# Patient Record
Sex: Female | Born: 1950 | Race: White | Hispanic: No
Health system: Southern US, Community
[De-identification: ages and names within clinical notes are randomized; demographics above are authoritative.]

## PROBLEM LIST (undated history)

## (undated) DIAGNOSIS — I651 Occlusion and stenosis of basilar artery: Secondary | ICD-10-CM

## (undated) DIAGNOSIS — E785 Hyperlipidemia, unspecified: Secondary | ICD-10-CM

## (undated) DIAGNOSIS — I10 Essential (primary) hypertension: Secondary | ICD-10-CM

## (undated) DIAGNOSIS — E119 Type 2 diabetes mellitus without complications: Secondary | ICD-10-CM

## (undated) DIAGNOSIS — E039 Hypothyroidism, unspecified: Secondary | ICD-10-CM

## (undated) DIAGNOSIS — I6509 Occlusion and stenosis of unspecified vertebral artery: Secondary | ICD-10-CM

---

## 2015-01-05 ENCOUNTER — Other Ambulatory Visit (HOSPITAL_COMMUNITY): Payer: Self-pay | Admitting: Interventional Radiology

## 2015-01-05 DIAGNOSIS — I6502 Occlusion and stenosis of left vertebral artery: Secondary | ICD-10-CM

## 2015-01-31 ENCOUNTER — Ambulatory Visit (HOSPITAL_COMMUNITY)
Admission: RE | Admit: 2015-01-31 | Discharge: 2015-01-31 | Disposition: A | Discharge status: Home / Self Care | Payer: Medicare HMO | Source: Ambulatory Visit | Attending: Interventional Radiology | Admitting: Interventional Radiology

## 2015-01-31 ENCOUNTER — Other Ambulatory Visit (HOSPITAL_COMMUNITY): Payer: Self-pay | Admitting: Interventional Radiology

## 2015-01-31 DIAGNOSIS — I6502 Occlusion and stenosis of left vertebral artery: Secondary | ICD-10-CM

## 2015-01-31 DIAGNOSIS — I771 Stricture of artery: Secondary | ICD-10-CM

## 2015-02-09 ENCOUNTER — Other Ambulatory Visit: Payer: Self-pay | Admitting: Radiology

## 2015-02-09 ENCOUNTER — Other Ambulatory Visit: Payer: Self-pay | Admitting: Physician Assistant

## 2015-02-10 ENCOUNTER — Other Ambulatory Visit (HOSPITAL_COMMUNITY): Payer: Self-pay | Admitting: Interventional Radiology

## 2015-02-10 ENCOUNTER — Ambulatory Visit (HOSPITAL_COMMUNITY)
Admission: RE | Admit: 2015-02-10 | Discharge: 2015-02-10 | Disposition: A | Discharge status: Home / Self Care | Payer: Medicare HMO | Source: Ambulatory Visit | Attending: Interventional Radiology | Admitting: Interventional Radiology

## 2015-02-10 ENCOUNTER — Encounter (HOSPITAL_COMMUNITY): Payer: Self-pay

## 2015-02-10 DIAGNOSIS — I6502 Occlusion and stenosis of left vertebral artery: Secondary | ICD-10-CM | POA: Diagnosis not present

## 2015-02-10 DIAGNOSIS — E119 Type 2 diabetes mellitus without complications: Secondary | ICD-10-CM | POA: Diagnosis not present

## 2015-02-10 DIAGNOSIS — I771 Stricture of artery: Secondary | ICD-10-CM

## 2015-02-10 DIAGNOSIS — Z7982 Long term (current) use of aspirin: Secondary | ICD-10-CM | POA: Insufficient documentation

## 2015-02-10 DIAGNOSIS — I10 Essential (primary) hypertension: Secondary | ICD-10-CM | POA: Diagnosis not present

## 2015-02-10 DIAGNOSIS — I671 Cerebral aneurysm, nonruptured: Secondary | ICD-10-CM | POA: Diagnosis not present

## 2015-02-10 DIAGNOSIS — E785 Hyperlipidemia, unspecified: Secondary | ICD-10-CM | POA: Insufficient documentation

## 2015-02-10 DIAGNOSIS — Z91040 Latex allergy status: Secondary | ICD-10-CM | POA: Insufficient documentation

## 2015-02-10 DIAGNOSIS — Z7984 Long term (current) use of oral hypoglycemic drugs: Secondary | ICD-10-CM | POA: Insufficient documentation

## 2015-02-10 DIAGNOSIS — E039 Hypothyroidism, unspecified: Secondary | ICD-10-CM | POA: Diagnosis not present

## 2015-02-10 DIAGNOSIS — Z885 Allergy status to narcotic agent status: Secondary | ICD-10-CM | POA: Diagnosis not present

## 2015-02-10 DIAGNOSIS — Z79899 Other long term (current) drug therapy: Secondary | ICD-10-CM | POA: Insufficient documentation

## 2015-02-10 HISTORY — DX: Occlusion and stenosis of unspecified vertebral artery: I65.1

## 2015-02-10 HISTORY — DX: Hypothyroidism, unspecified: E03.9

## 2015-02-10 HISTORY — DX: Type 2 diabetes mellitus without complications: E11.9

## 2015-02-10 HISTORY — DX: Essential (primary) hypertension: I10

## 2015-02-10 HISTORY — DX: Occlusion and stenosis of unspecified vertebral artery: I65.09

## 2015-02-10 HISTORY — DX: Hyperlipidemia, unspecified: E78.5

## 2015-02-10 LAB — BASIC METABOLIC PANEL
ANION GAP: 14 (ref 5–15)
BUN: 10 mg/dL (ref 6–20)
CALCIUM: 9.8 mg/dL (ref 8.9–10.3)
CO2: 27 mmol/L (ref 22–32)
Chloride: 99 mmol/L — ABNORMAL LOW (ref 101–111)
Creatinine, Ser: 0.8 mg/dL (ref 0.44–1.00)
GLUCOSE: 126 mg/dL — AB (ref 65–99)
POTASSIUM: 3.6 mmol/L (ref 3.5–5.1)
SODIUM: 140 mmol/L (ref 135–145)

## 2015-02-10 LAB — CBC
HCT: 37.1 % (ref 36.0–46.0)
HEMOGLOBIN: 12.7 g/dL (ref 12.0–15.0)
MCH: 31.2 pg (ref 26.0–34.0)
MCHC: 34.2 g/dL (ref 30.0–36.0)
MCV: 91.2 fL (ref 78.0–100.0)
Platelets: 250 10*3/uL (ref 150–400)
RBC: 4.07 MIL/uL (ref 3.87–5.11)
RDW: 12.9 % (ref 11.5–15.5)
WBC: 4.8 10*3/uL (ref 4.0–10.5)

## 2015-02-10 LAB — APTT: APTT: 29 s (ref 24–37)

## 2015-02-10 LAB — GLUCOSE, CAPILLARY
GLUCOSE-CAPILLARY: 102 mg/dL — AB (ref 65–99)
Glucose-Capillary: 122 mg/dL — ABNORMAL HIGH (ref 65–99)

## 2015-02-10 LAB — PROTIME-INR
INR: 1.1 (ref 0.00–1.49)
PROTHROMBIN TIME: 14.3 s (ref 11.6–15.2)

## 2015-02-10 MED ORDER — FENTANYL CITRATE (PF) 100 MCG/2ML IJ SOLN
INTRAMUSCULAR | Status: AC | PRN
  Administered 2015-02-10 (×2): 25 ug via INTRAVENOUS

## 2015-02-10 MED ORDER — HEPARIN SODIUM (PORCINE) 1000 UNIT/ML IJ SOLN
INTRAMUSCULAR | Status: AC | PRN
  Administered 2015-02-10: 1000 [IU] via INTRAVENOUS

## 2015-02-10 MED ORDER — SODIUM CHLORIDE 0.9 % IV SOLN
INTRAVENOUS | Status: DC
  Administered 2015-02-10: 07:00:00 via INTRAVENOUS

## 2015-02-10 MED ORDER — FENTANYL CITRATE (PF) 100 MCG/2ML IJ SOLN
INTRAMUSCULAR | Status: AC
  Filled 2015-02-10: qty 2

## 2015-02-10 MED ORDER — LIDOCAINE HCL 1 % IJ SOLN
INTRAMUSCULAR | Status: AC
  Filled 2015-02-10: qty 20

## 2015-02-10 MED ORDER — HEPARIN SOD (PORK) LOCK FLUSH 100 UNIT/ML IV SOLN
INTRAVENOUS | Status: AC
  Filled 2015-02-10: qty 20

## 2015-02-10 MED ORDER — MIDAZOLAM HCL 2 MG/2ML IJ SOLN
INTRAMUSCULAR | Status: AC
  Filled 2015-02-10: qty 2

## 2015-02-10 MED ORDER — IOHEXOL 300 MG/ML  SOLN
150.0000 mL | Freq: Once | INTRAMUSCULAR | Status: AC | PRN
  Administered 2015-02-10: 80 mL via INTRAVENOUS

## 2015-02-10 MED ORDER — MIDAZOLAM HCL 2 MG/2ML IJ SOLN
INTRAMUSCULAR | Status: AC | PRN
  Administered 2015-02-10: 1 mg via INTRAVENOUS

## 2015-02-10 MED ORDER — SODIUM CHLORIDE 0.9 % IV SOLN: INTRAVENOUS | Status: AC

## 2015-02-10 NOTE — Sedation Documentation (Signed)
Patient denies pain and is resting comfortably.  

## 2015-02-10 NOTE — Discharge Instructions (Signed)

## 2015-02-10 NOTE — H&P (Signed)
Chief Complaint: "I'm here for an angiogram"  HPI: Kendra Acevedo is an 65 y.o. female seen in consult by Dr. Estanislado Pandy for symptoms felt related to vertebrobasilar artery stenosis. She is scheduled for cerebral arteriogram today. PMHx, meds, labs, allergies reviewed. Has been NPO this am  Past Medical History:  Past Medical History  Diagnosis Date  . Diabetes mellitus ([place])   . HTN (hypertension)   . Hyperlipidemia   . Hypothyroidism   . Vertebrobasilar artery stenosis     Past Surgical History: History reviewed. No pertinent past surgical history.  Family History: History reviewed. No pertinent family history.  Social History:  has no tobacco, alcohol, and drug history on file.  Allergies:  Allergies  Allergen Reactions  . Hydrocodone-Acetaminophen Itching    Other reaction(s): Tachycardia / Palpitations  (intolerance)  . Latex     Other reaction(s): Other (See Comments) Blisters skin  . Codeine Itching and Rash    Other reaction(s): Tachycardia / Palpitations  (intolerance)  . Yellow Dye Itching and Rash    Other reaction(s): Other (See Comments) Blisters **Pt. Cannot take any YELLOW medications**    Medications:   Medication List    ASK your doctor about these medications        amitriptyline 50 MG tablet  Commonly known as:  ELAVIL  Take 50 mg by mouth at bedtime.     aspirin EC 81 MG tablet  Take 81 mg by mouth daily.     b complex vitamins tablet  Take 1 tablet by mouth daily.     cycloSPORINE 0.05 % ophthalmic emulsion  Commonly known as:  RESTASIS  Place 1 drop into both eyes 2 (two) times daily.     hydrochlorothiazide 25 MG tablet  Commonly known as:  HYDRODIURIL  Take 12.5 mg by mouth daily.     levETIRAcetam 500 MG tablet  Commonly known as:  KEPPRA  Take 500 mg by mouth 2 (two) times daily.     levothyroxine 50 MCG tablet  Commonly known as:  SYNTHROID, LEVOTHROID  Take 50 mcg by mouth daily before breakfast.     lisinopril 20 MG  tablet  Commonly known as:  PRINIVIL,ZESTRIL  Take 10 mg by mouth at bedtime.     metFORMIN 1000 MG tablet  Commonly known as:  GLUCOPHAGE  Take 500-1,000 mg by mouth 2 (two) times daily with a meal. Take 1000 mg every morning and 500 mg every evening     multivitamin capsule  Take 1 capsule by mouth daily.     simvastatin 40 MG tablet  Commonly known as:  ZOCOR  Take 40 mg by mouth every evening.     tiZANidine 4 MG tablet  Commonly known as:  ZANAFLEX  Take 4 mg by mouth at bedtime.     vitamin B-12 1000 MCG tablet  Commonly known as:  CYANOCOBALAMIN  Take 2,000 mcg by mouth daily.        Please HPI for pertinent positives, otherwise complete 10 system ROS negative.  Physical Exam: BP 158/78 mmHg  Pulse 86  Temp(Src) 97.7 F (36.5 C) (Oral)  Resp 17  Ht _0  (1.651 m)  Wt 167 lb 11.2 oz (76.068 kg)  BMI 27.91 kg/m2  SpO2 100% Body mass index is 27.91 kg/(m^2).   General Appearance:  Alert, cooperative, no distress, appears stated age  Head:  Normocephalic, without obvious abnormality, atraumatic  ENT: Unremarkable  Neck: Supple, symmetrical, trachea midline  Lungs:   Clear to auscultation bilaterally, no  w/r/r, respirations unlabored without use of accessory muscles.  Chest Wall:  No tenderness or deformity  Heart:  Regular rate and rhythm, S1, S2 normal, no murmur, rub or gallop.  Abdomen:   Soft, non-tender, non distended.  Extremities: Extremities normal, atraumatic, no cyanosis or edema  Pulses: 2+ and symmetric femoral  Neurologic: Normal affect, no gross deficits.  Labs: Results for orders placed or performed during the hospital encounter of 02/10/15 (from the past 48 hour(s))  Glucose, capillary     Status: Abnormal   Collection Time: 02/10/15  6:32 AM  Result Value Ref Range   Glucose-Capillary 122 (H) 65 - 99 mg/dL  APTT     Status: None   Collection Time: 02/10/15  7:04 AM  Result Value Ref Range   aPTT 29 24 - 37 seconds  Basic metabolic  panel     Status: Abnormal   Collection Time: 02/10/15  7:04 AM  Result Value Ref Range   Sodium 140 135 - 145 mmol/L   Potassium 3.6 3.5 - 5.1 mmol/L   Chloride 99 (L) 101 - 111 mmol/L   CO2 27 22 - 32 mmol/L   Glucose, Bld 126 (H) 65 - 99 mg/dL   BUN 10 6 - 20 mg/dL   Creatinine, Ser 0.80 0.44 - 1.00 mg/dL   Calcium 9.8 8.9 - 10.3 mg/dL   GFR calc non Af Amer >60 >60 mL/min   GFR calc Af Amer >60 >60 mL/min    Comment: (NOTE) The eGFR has been calculated using the CKD EPI equation. This calculation has not been validated in all clinical situations. eGFR's persistently <60 mL/min signify possible Chronic Kidney Disease.    Anion gap 14 5 - 15  CBC     Status: None   Collection Time: 02/10/15  7:04 AM  Result Value Ref Range   WBC 4.8 4.0 - 10.5 K/uL   RBC 4.07 3.87 - 5.11 MIL/uL   Hemoglobin 12.7 12.0 - 15.0 g/dL   HCT 37.1 36.0 - 46.0 %   MCV 91.2 78.0 - 100.0 fL   MCH 31.2 26.0 - 34.0 pg   MCHC 34.2 30.0 - 36.0 g/dL   RDW 12.9 11.5 - 15.5 %   Platelets 250 150 - 400 K/uL  Protime-INR     Status: None   Collection Time: 02/10/15  7:04 AM  Result Value Ref Range   Prothrombin Time 14.3 11.6 - 15.2 seconds   INR 1.10 0.00 - 1.49    Imaging: No results found.  Assessment/Plan Vertebrobasilar stenosis Plan for cerebral angio Labs ok Risks and Benefits discussed with the patient including, but not limited to bleeding, infection, vascular injury or contrast induced renal failure. All of the patient's questions were answered, patient is agreeable to proceed. Consent signed and in chart.   Ascencion Dike PA-C 02/10/2015, 8:25 AM

## 2015-02-10 NOTE — Progress Notes (Signed)
Pt up to bathroom tolerated well.  Right groin level 0 after ambulation

## 2015-02-10 NOTE — Procedures (Signed)
S/P 4 cerebral arterigram  RT CFA approach. 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

## 2015-03-03 ENCOUNTER — Other Ambulatory Visit (HOSPITAL_COMMUNITY): Payer: Self-pay | Admitting: Interventional Radiology

## 2015-03-03 DIAGNOSIS — I771 Stricture of artery: Secondary | ICD-10-CM

## 2015-03-03 DIAGNOSIS — I671 Cerebral aneurysm, nonruptured: Secondary | ICD-10-CM

## 2015-03-17 ENCOUNTER — Ambulatory Visit (HOSPITAL_COMMUNITY)
Admission: RE | Admit: 2015-03-17 | Discharge: 2015-03-17 | Disposition: A | Discharge status: Home / Self Care | Payer: Medicare HMO | Source: Ambulatory Visit | Attending: Interventional Radiology | Admitting: Interventional Radiology

## 2015-03-17 DIAGNOSIS — I671 Cerebral aneurysm, nonruptured: Secondary | ICD-10-CM

## 2015-03-17 DIAGNOSIS — I771 Stricture of artery: Secondary | ICD-10-CM

## 2015-04-04 ENCOUNTER — Telehealth (HOSPITAL_COMMUNITY): Payer: Self-pay | Admitting: Interventional Radiology

## 2015-04-04 NOTE — Telephone Encounter (Signed)
Spoke to W. R. BerkleyDeveshwar and told him that the pt had stopped her Plavix all together and was only taking Aspirin 81mg  1 daily. She states that the Plavix gave her nosebleeds, extreme bruising, chest and back heaviness. She discontinued this on her own and says that her sx have now resolved. Deveshwar told her to continue as is and that he would follow-up with a cerebral angiogram in 6 month's time. If she develops any sx she is to call us back immediately. Pt agrees with this plan of care. JM

## 2015-04-04 NOTE — Telephone Encounter (Signed)
Spoke to pt, she says she stopped her Plavix as of Saturday. She states it was causing her "chest heaviness and back pain." I told her I would speak to Dr. Monica Martinezev and call her back. JM

## 2015-06-01 ENCOUNTER — Other Ambulatory Visit (HOSPITAL_COMMUNITY): Payer: Self-pay | Admitting: Interventional Radiology

## 2015-06-01 ENCOUNTER — Telehealth (HOSPITAL_COMMUNITY): Payer: Self-pay

## 2015-06-01 DIAGNOSIS — I771 Stricture of artery: Secondary | ICD-10-CM

## 2015-06-01 NOTE — Telephone Encounter (Signed)
Called to schedule CT scan, left message for pt to call back. AW 

## 2015-06-07 ENCOUNTER — Ambulatory Visit (HOSPITAL_COMMUNITY): Admission: RE | Admit: 2015-06-07 | Payer: Medicare HMO | Source: Ambulatory Visit

## 2015-06-07 ENCOUNTER — Encounter (HOSPITAL_COMMUNITY): Payer: Self-pay

## 2015-06-07 ENCOUNTER — Ambulatory Visit (HOSPITAL_COMMUNITY)
Admission: RE | Admit: 2015-06-07 | Discharge: 2015-06-07 | Disposition: A | Discharge status: Home / Self Care | Payer: Medicare HMO | Source: Ambulatory Visit | Attending: Interventional Radiology | Admitting: Interventional Radiology

## 2015-06-07 DIAGNOSIS — I6529 Occlusion and stenosis of unspecified carotid artery: Secondary | ICD-10-CM | POA: Insufficient documentation

## 2015-06-07 DIAGNOSIS — I6502 Occlusion and stenosis of left vertebral artery: Secondary | ICD-10-CM | POA: Insufficient documentation

## 2015-06-07 DIAGNOSIS — I771 Stricture of artery: Secondary | ICD-10-CM | POA: Diagnosis not present

## 2015-06-07 LAB — POCT I-STAT CREATININE: Creatinine, Ser: 0.7 mg/dL (ref 0.44–1.00)

## 2015-06-07 MED ORDER — IOPAMIDOL (ISOVUE-370) INJECTION 76%
INTRAVENOUS | Status: AC
  Administered 2015-06-07: 50 mL
  Filled 2015-06-07: qty 50

## 2015-06-27 ENCOUNTER — Other Ambulatory Visit (HOSPITAL_COMMUNITY): Payer: Self-pay | Admitting: Interventional Radiology

## 2015-06-27 DIAGNOSIS — I771 Stricture of artery: Secondary | ICD-10-CM

## 2015-06-27 DIAGNOSIS — I729 Aneurysm of unspecified site: Secondary | ICD-10-CM

## 2015-07-03 ENCOUNTER — Other Ambulatory Visit: Payer: Self-pay | Admitting: Radiology

## 2015-07-04 ENCOUNTER — Encounter (HOSPITAL_COMMUNITY): Payer: Self-pay

## 2015-07-04 ENCOUNTER — Ambulatory Visit (HOSPITAL_COMMUNITY)
Admission: RE | Admit: 2015-07-04 | Discharge: 2015-07-04 | Disposition: A | Discharge status: Home / Self Care | Payer: Medicare HMO | Source: Ambulatory Visit | Attending: Interventional Radiology | Admitting: Interventional Radiology

## 2015-07-04 DIAGNOSIS — E785 Hyperlipidemia, unspecified: Secondary | ICD-10-CM | POA: Diagnosis not present

## 2015-07-04 DIAGNOSIS — E119 Type 2 diabetes mellitus without complications: Secondary | ICD-10-CM | POA: Insufficient documentation

## 2015-07-04 DIAGNOSIS — E039 Hypothyroidism, unspecified: Secondary | ICD-10-CM | POA: Diagnosis not present

## 2015-07-04 DIAGNOSIS — I10 Essential (primary) hypertension: Secondary | ICD-10-CM | POA: Diagnosis not present

## 2015-07-04 DIAGNOSIS — I6502 Occlusion and stenosis of left vertebral artery: Secondary | ICD-10-CM | POA: Insufficient documentation

## 2015-07-04 DIAGNOSIS — I771 Stricture of artery: Secondary | ICD-10-CM | POA: Insufficient documentation

## 2015-07-04 DIAGNOSIS — Z7984 Long term (current) use of oral hypoglycemic drugs: Secondary | ICD-10-CM | POA: Insufficient documentation

## 2015-07-04 DIAGNOSIS — I729 Aneurysm of unspecified site: Secondary | ICD-10-CM

## 2015-07-04 DIAGNOSIS — Z7982 Long term (current) use of aspirin: Secondary | ICD-10-CM | POA: Insufficient documentation

## 2015-07-04 DIAGNOSIS — I671 Cerebral aneurysm, nonruptured: Secondary | ICD-10-CM | POA: Diagnosis not present

## 2015-07-04 LAB — BASIC METABOLIC PANEL
Anion gap: 9 (ref 5–15)
BUN: 11 mg/dL (ref 6–20)
CO2: 30 mmol/L (ref 22–32)
CREATININE: 0.76 mg/dL (ref 0.44–1.00)
Calcium: 10 mg/dL (ref 8.9–10.3)
Chloride: 99 mmol/L — ABNORMAL LOW (ref 101–111)
GFR calc Af Amer: >60 mL/min (ref >60)
Glucose, Bld: 133 mg/dL — ABNORMAL HIGH (ref 65–99)
POTASSIUM: 3.8 mmol/L (ref 3.5–5.1)
SODIUM: 138 mmol/L (ref 135–145)

## 2015-07-04 LAB — CBC WITH DIFFERENTIAL/PLATELET
BASOS ABS: 0 10*3/uL (ref 0.0–0.1)
Basophils Relative: 1 %
EOS ABS: 0.3 10*3/uL (ref 0.0–0.7)
EOS PCT: 7 %
HCT: 37.7 % (ref 36.0–46.0)
Hemoglobin: 12.5 g/dL (ref 12.0–15.0)
LYMPHS PCT: 35 %
Lymphs Abs: 1.6 10*3/uL (ref 0.7–4.0)
MCH: 29.8 pg (ref 26.0–34.0)
MCHC: 33.2 g/dL (ref 30.0–36.0)
MCV: 90 fL (ref 78.0–100.0)
Monocytes Absolute: 0.3 10*3/uL (ref 0.1–1.0)
Monocytes Relative: 7 %
Neutro Abs: 2.2 10*3/uL (ref 1.7–7.7)
Neutrophils Relative %: 50 %
PLATELETS: 237 10*3/uL (ref 150–400)
RBC: 4.19 MIL/uL (ref 3.87–5.11)
RDW: 12.8 % (ref 11.5–15.5)
WBC: 4.4 10*3/uL (ref 4.0–10.5)

## 2015-07-04 LAB — GLUCOSE, CAPILLARY: GLUCOSE-CAPILLARY: 125 mg/dL — AB (ref 65–99)

## 2015-07-04 LAB — PROTIME-INR
INR: 1.1 (ref 0.00–1.49)
PROTHROMBIN TIME: 14.4 s (ref 11.6–15.2)

## 2015-07-04 MED ORDER — SODIUM CHLORIDE 0.9 % IV SOLN
INTRAVENOUS | Status: DC
  Administered 2015-07-04: 09:00:00 via INTRAVENOUS

## 2015-07-04 MED ORDER — LIDOCAINE HCL 1 % IJ SOLN
INTRAMUSCULAR | Status: AC
  Filled 2015-07-04: qty 20

## 2015-07-04 MED ORDER — IOPAMIDOL (ISOVUE-300) INJECTION 61%
INTRAVENOUS | Status: AC
  Filled 2015-07-04: qty 150

## 2015-07-04 NOTE — H&P (Signed)
Chief Complaint: Patient was seen in consultation today for cerebral arteriogram at the request of Dr Curt Bears  Referring Physician(s): Dr Curt Bears   Supervising Physician: Julieanne Cotton  Patient Status: Outpatient  History of Present Illness: Kendra Acevedo is a 65 y.o. female   Symptoms of vertigo; dizziness--worsening symptoms recently Previous cerebral arteriogram 03/2015 reveals: Right vertebrobasilar artery stenosis and R Internal carotid artery aneurysm  Scheduled for routine follow up for 08/2015 But with new and worsening sxs-- scheduled for today  CTA 06/07/2015: IMPRESSION: 1. Stable left vertebral artery findings since February with occlusion at its origin and at the skullbase. Continued retrograde supply of the distal left V4 segment and left PICA origin. No right vertebral artery stenosis. 2. Stable minimal to mild for age carotid atherosclerosis with no significant stenosis. No CTA evidence of the small right ICA superior hypophyseal region aneurysm suspected by conventional angiogram. 3. Stable mild distal right MCA M1 irregularity probably due to atherosclerosis without significant stenosis. 4. Stable and negative CT appearance of the brain. No acute findings in the neck.  Scheduled for cerebral arteriogram for full evaluation of vessels  Past Medical History  Diagnosis Date  . Diabetes mellitus ([facility])   . HTN (hypertension)   . Hyperlipidemia   . Hypothyroidism   . Vertebrobasilar artery stenosis     History reviewed. No pertinent past surgical history.  Allergies: Hydrocodone-acetaminophen; Latex; Plavix; Codeine; and Yellow dye  Medications: Prior to Admission medications   Medication Sig Start Date End Date Taking? Authorizing Provider  amitriptyline (ELAVIL) 50 MG tablet Take 50 mg by mouth at bedtime.   Yes Historical Provider, MD  aspirin EC 81 MG tablet Take 81 mg by mouth daily.   Yes Historical Provider, MD  b complex  vitamins tablet Take 1 tablet by mouth daily.   Yes Historical Provider, MD  cycloSPORINE (RESTASIS) 0.05 % ophthalmic emulsion Place 1 drop into both eyes 2 (two) times daily.   Yes Historical Provider, MD  hydrochlorothiazide (HYDRODIURIL) 25 MG tablet Take 12.5 mg by mouth daily.   Yes Historical Provider, MD  levETIRAcetam (KEPPRA) 500 MG tablet Take 500-1,000 mg by mouth daily.    Yes Historical Provider, MD  levothyroxine (SYNTHROID, LEVOTHROID) 50 MCG tablet Take 50 mcg by mouth daily before breakfast.    Yes Historical Provider, MD  lisinopril (PRINIVIL,ZESTRIL) 20 MG tablet Take 10 mg by mouth at bedtime.   Yes Historical Provider, MD  metFORMIN (GLUCOPHAGE) 1000 MG tablet Take 500-1,000 mg by mouth 2 (two) times daily with a meal. Take 1000 mg every morning and 500 mg every evening 12/26/14  Yes Historical Provider, MD  Multiple Vitamin (MULTIVITAMIN) capsule Take 1 capsule by mouth daily.    Yes Historical Provider, MD  simvastatin (ZOCOR) 40 MG tablet Take 40 mg by mouth every evening.   Yes Historical Provider, MD  sodium chloride (OCEAN) 0.65 % SOLN nasal spray Place 1 spray into both nostrils as needed for congestion.   Yes Historical Provider, MD  tiZANidine (ZANAFLEX) 4 MG tablet Take 4 mg by mouth at bedtime.   Yes Historical Provider, MD  vitamin B-12 (CYANOCOBALAMIN) 1000 MCG tablet Take 2,000 mcg by mouth daily.    Yes Historical Provider, MD     History reviewed. No pertinent family history.  Social History   Social History  . Marital Status: Widowed    Spouse Name: N/A  . Number of Children: N/A  . Years of Education: N/A   Social History Main  Topics  . Smoking status: None  . Smokeless tobacco: None  . Alcohol Use: None  . Drug Use: None  . Sexual Activity: Not Asked   Other Topics Concern  . None   Social History Narrative     Review of Systems: A 12 point ROS discussed and pertinent positives are indicated in the HPI above.  All other systems are  negative.  Review of Systems  Constitutional: Positive for activity change. Negative for fever, appetite change and fatigue.  HENT: Positive for tinnitus and trouble swallowing.   Eyes: Negative for visual disturbance.  Respiratory: Negative for shortness of breath.   Gastrointestinal: Negative for abdominal pain.  Neurological: Positive for dizziness, speech difficulty, weakness and light-headedness. Negative for tremors, seizures, syncope, facial asymmetry, numbness and headaches.       Difficulty with some word finding  Psychiatric/Behavioral: Negative for behavioral problems and confusion.    Vital Signs: BP 145/91 mmHg  Pulse 81  Temp(Src) 98.4 F (36.9 C) (Oral)  Resp 16  Ht 5' 5.5" (1.664 m)  Wt 170 lb (77.111 kg)  BMI 27.85 kg/m2  SpO2 100%  Physical Exam  HENT:  Speech is slow  Eyes: EOM are normal.  Neck: Neck supple.  Cardiovascular: Normal rate, regular rhythm and normal heart sounds.   Pulmonary/Chest: Effort normal and breath sounds normal.  Abdominal: Soft. Bowel sounds are normal.  Musculoskeletal: Normal range of motion.  Hand writing slow and some what difficult  Neurological: She is alert.  Skin: Skin is warm and dry.  Psychiatric: She has a normal mood and affect. Her behavior is normal. Judgment and thought content normal.  Nursing note and vitals reviewed.   Mallampati Score:  MD Evaluation Airway: WNL Heart: WNL Abdomen: WNL Chest/ Lungs: WNL ASA  Classification: 3 Mallampati/Airway Score: Two  Imaging: Ct Angio Head W/cm &/or Wo Cm  06/07/2015  CLINICAL DATA:  65 year old female with left vertebral artery occlusion discovered in December 2016. Vertebrobasilar ischemic symptoms. Right ICA superior hypophyseal 2-3 mm aneurysm. Subsequent encounter. EXAM: CT ANGIOGRAPHY HEAD AND NECK TECHNIQUE: Multidetector CT imaging of the head and neck was performed using the standard protocol during bolus administration of intravenous contrast. Multiplanar  CT image reconstructions and MIPs were obtained to evaluate the vascular anatomy. Carotid stenosis measurements (when applicable) are obtained utilizing NASCET criteria, using the distal internal carotid diameter as the denominator. CONTRAST:  50 mL Isovue 370. COMPARISON:  [facility] CTA head and neck 12/12/2014. Cerebral angiogram 02/10/2015. FINDINGS: CT HEAD Brain: Stable cerebral volume. Stable gray-white matter differentiation throughout the brain. No ventriculomegaly. No midline shift, mass effect, or evidence of intracranial mass lesion. No acute intracranial hemorrhage identified. No cortical encephalomalacia identified. Calvarium and skull base: Stable and negative. Paranasal sinuses: Clear. Orbits: Stable and negative. CTA NECK Skeleton: No acute osseous abnormality identified. Minimal degenerative changes in the spine. Other neck: Negative lung apices. No superior mediastinal lymphadenopathy. Negative thyroid, larynx, pharynx, parapharyngeal spaces, retropharyngeal space, sublingual space, submandibular glands and parotid glands. No cervical lymphadenopathy. Aortic arch: Bovine type arch configuration re- demonstrated. Stable mild to moderate mostly calcified arch atherosclerosis. No great vessel origins stenosis. Right carotid system: Stable and negative. Left carotid system: Mild calcified plaque at the left carotid bifurcation appears stable. No proximal left ICA stenosis. Otherwise negative cervical left ICA. Vertebral arteries: No proximal right subclavian artery stenosis. Normal right vertebral artery origin. Dominant right vertebral artery is stable and negative to the skullbase. Proximal left subclavian artery soft and calcified plaque re-  demonstrated approximating 50% stenosis with respect to the distal vessel. Occluded left vertebral artery origin again noted. Stable appearance of some reconstituted flow in the left V2 segment, but the vessel again appears occluded in the V3 segment.  CTA HEAD Posterior circulation: No distal right vertebral artery stenosis. Normal right PICA origin. Patent left V4 segment from the vertebrobasilar junction to the left PICA origin, seen to be supplied in a retrograde fashion on the February comparison. The left V4 segment proximal to the PICA appears more completely occluded than in December, stable since February. Patent basilar artery without stenosis. SCA and PCA origins are stable, with mild irregularity of the left P1 segment. Bilateral PCA branches are within normal limits. Posterior communicating arteries are diminutive or absent. Anterior circulation: Both ICA siphons remain patent. Calcified plaque is greater on the right and most affects the right supraclinoid segment, although no significant stenosis was demonstrated in February. Normal ophthalmic artery origins. No right ICA superior hypophyseal or other siphon aneurysm is identified by CTA. The angiogram findings might have been related to atherosclerotic pseudo lesion (e.g. Series 11, image 67 today). Patent carotid termini. Normal MCA and ACA origins. Normal anterior communicating artery and bilateral ACA branches. Left MCA M1 segment, bifurcation, and left MCA branches are within normal limits. Right MCA M1 segment is stable with mild irregularity at and distal to the right anterior temporal artery origin. No significant right MCA stenosis (series 13, image 19). Right MCA bifurcation and right MCA branches are within normal limits. Venous sinuses: Patent. Anatomic variants: None. Delayed phase: No abnormal enhancement identified. IMPRESSION: 1. Stable left vertebral artery findings since February with occlusion at its origin and at the skullbase. Continued retrograde supply of the distal left V4 segment and left PICA origin. No right vertebral artery stenosis. 2. Stable minimal to mild for age carotid atherosclerosis with no significant stenosis. No CTA evidence of the small right ICA superior  hypophyseal region aneurysm suspected by conventional angiogram. 3. Stable mild distal right MCA M1 irregularity probably due to atherosclerosis without significant stenosis. 4. Stable and negative CT appearance of the brain. No acute findings in the neck. Electronically Signed   By: Odessa FlemingH  Hall M.D.   On: 06/07/2015 13:58   Ct Angio Neck W/cm &/or Wo/cm  06/07/2015  CLINICAL DATA:  65 year old female with left vertebral artery occlusion discovered in December 2016. Vertebrobasilar ischemic symptoms. Right ICA superior hypophyseal 2-3 mm aneurysm. Subsequent encounter. EXAM: CT ANGIOGRAPHY HEAD AND NECK TECHNIQUE: Multidetector CT imaging of the head and neck was performed using the standard protocol during bolus administration of intravenous contrast. Multiplanar CT image reconstructions and MIPs were obtained to evaluate the vascular anatomy. Carotid stenosis measurements (when applicable) are obtained utilizing NASCET criteria, using the distal internal carotid diameter as the denominator. CONTRAST:  50 mL Isovue 370. COMPARISON:  [facility] CTA head and neck 12/12/2014. Cerebral angiogram 02/10/2015. FINDINGS: CT HEAD Brain: Stable cerebral volume. Stable gray-white matter differentiation throughout the brain. No ventriculomegaly. No midline shift, mass effect, or evidence of intracranial mass lesion. No acute intracranial hemorrhage identified. No cortical encephalomalacia identified. Calvarium and skull base: Stable and negative. Paranasal sinuses: Clear. Orbits: Stable and negative. CTA NECK Skeleton: No acute osseous abnormality identified. Minimal degenerative changes in the spine. Other neck: Negative lung apices. No superior mediastinal lymphadenopathy. Negative thyroid, larynx, pharynx, parapharyngeal spaces, retropharyngeal space, sublingual space, submandibular glands and parotid glands. No cervical lymphadenopathy. Aortic arch: Bovine type arch configuration re- demonstrated. Stable mild to  moderate  mostly calcified arch atherosclerosis. No great vessel origins stenosis. Right carotid system: Stable and negative. Left carotid system: Mild calcified plaque at the left carotid bifurcation appears stable. No proximal left ICA stenosis. Otherwise negative cervical left ICA. Vertebral arteries: No proximal right subclavian artery stenosis. Normal right vertebral artery origin. Dominant right vertebral artery is stable and negative to the skullbase. Proximal left subclavian artery soft and calcified plaque re- demonstrated approximating 50% stenosis with respect to the distal vessel. Occluded left vertebral artery origin again noted. Stable appearance of some reconstituted flow in the left V2 segment, but the vessel again appears occluded in the V3 segment. CTA HEAD Posterior circulation: No distal right vertebral artery stenosis. Normal right PICA origin. Patent left V4 segment from the vertebrobasilar junction to the left PICA origin, seen to be supplied in a retrograde fashion on the February comparison. The left V4 segment proximal to the PICA appears more completely occluded than in December, stable since February. Patent basilar artery without stenosis. SCA and PCA origins are stable, with mild irregularity of the left P1 segment. Bilateral PCA branches are within normal limits. Posterior communicating arteries are diminutive or absent. Anterior circulation: Both ICA siphons remain patent. Calcified plaque is greater on the right and most affects the right supraclinoid segment, although no significant stenosis was demonstrated in February. Normal ophthalmic artery origins. No right ICA superior hypophyseal or other siphon aneurysm is identified by CTA. The angiogram findings might have been related to atherosclerotic pseudo lesion (e.g. Series 11, image 67 today). Patent carotid termini. Normal MCA and ACA origins. Normal anterior communicating artery and bilateral ACA branches. Left MCA M1 segment,  bifurcation, and left MCA branches are within normal limits. Right MCA M1 segment is stable with mild irregularity at and distal to the right anterior temporal artery origin. No significant right MCA stenosis (series 13, image 19). Right MCA bifurcation and right MCA branches are within normal limits. Venous sinuses: Patent. Anatomic variants: None. Delayed phase: No abnormal enhancement identified. IMPRESSION: 1. Stable left vertebral artery findings since February with occlusion at its origin and at the skullbase. Continued retrograde supply of the distal left V4 segment and left PICA origin. No right vertebral artery stenosis. 2. Stable minimal to mild for age carotid atherosclerosis with no significant stenosis. No CTA evidence of the small right ICA superior hypophyseal region aneurysm suspected by conventional angiogram. 3. Stable mild distal right MCA M1 irregularity probably due to atherosclerosis without significant stenosis. 4. Stable and negative CT appearance of the brain. No acute findings in the neck. Electronically Signed   By: Odessa Fleming M.D.   On: 06/07/2015 13:58    Labs:  CBC:  Recent Labs  02/10/15 0704 07/04/15 0845  WBC 4.8 4.4  HGB 12.7 12.5  HCT 37.1 37.7  PLT 250 237    COAGS:  Recent Labs  02/10/15 0704 07/04/15 0845  INR 1.10 1.10  APTT 29  --     BMP:  Recent Labs  02/10/15 0704 06/07/15 0943 07/04/15 0845  NA 140  --  138  K 3.6  --  3.8  CL 99*  --  99*  CO2 27  --  30  GLUCOSE 126*  --  133*  BUN 10  --  11  CALCIUM 9.8  --  10.0  CREATININE 0.80 0.70 0.76  GFRNONAA >60  --  >60  GFRAA >60  --  >60    LIVER FUNCTION TESTS: No results for input(s): BILITOT, AST, ALT, ALKPHOS,  PROT, ALBUMIN in the last 8760 hours.  TUMOR MARKERS: No results for input(s): AFPTM, CEA, CA199, CHROMGRNA in the last 8760 hours.  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 All of the patient's questions were answered, patient is agreeable to proceed. Consent signed and in chart.   Thank you for this interesting consult.  I greatly enjoyed meeting Kendra Acevedo and look forward to participating in their care.  A copy of this report was sent to the requesting provider on this date.  Electronically Signed: Ralene MuskratURPIN,Takira Sherrin A 07/04/2015, 9:22 AM   I spent a total of  30 Minutes   in face to face in clinical consultation, greater than 50% of which was counseling/coordinating care for cerebral arteriogram

## 2015-08-08 ENCOUNTER — Telehealth (HOSPITAL_COMMUNITY): Payer: Self-pay | Admitting: Interventional Radiology

## 2015-08-08 NOTE — Telephone Encounter (Signed)
Called pt to discuss insurance complications for her upcoming MRI that is due.

## 2015-08-09 ENCOUNTER — Other Ambulatory Visit (HOSPITAL_COMMUNITY): Payer: Self-pay | Admitting: Interventional Radiology

## 2015-08-09 DIAGNOSIS — I771 Stricture of artery: Secondary | ICD-10-CM

## 2015-08-18 ENCOUNTER — Ambulatory Visit (HOSPITAL_COMMUNITY): Payer: Medicare HMO

## 2015-08-18 ENCOUNTER — Ambulatory Visit (HOSPITAL_COMMUNITY)
Admission: RE | Admit: 2015-08-18 | Discharge: 2015-08-18 | Disposition: A | Discharge status: Home / Self Care | Payer: Medicare HMO | Source: Ambulatory Visit | Attending: Interventional Radiology | Admitting: Interventional Radiology

## 2015-08-18 DIAGNOSIS — I6502 Occlusion and stenosis of left vertebral artery: Secondary | ICD-10-CM | POA: Insufficient documentation

## 2015-08-18 DIAGNOSIS — I771 Stricture of artery: Secondary | ICD-10-CM | POA: Insufficient documentation

## 2015-08-18 DIAGNOSIS — I6782 Cerebral ischemia: Secondary | ICD-10-CM | POA: Insufficient documentation

## 2015-08-18 LAB — CREATININE, SERUM
Creatinine, Ser: 0.75 mg/dL (ref 0.44–1.00)
GFR calc Af Amer: >60 mL/min (ref >60)
GFR calc non Af Amer: >60 mL/min (ref >60)

## 2015-08-18 MED ORDER — GADOBENATE DIMEGLUMINE 529 MG/ML IV SOLN
17.0000 mL | Freq: Once | INTRAVENOUS | Status: AC | PRN
  Administered 2015-08-18: 17 mL via INTRAVENOUS

## 2015-08-25 ENCOUNTER — Other Ambulatory Visit (HOSPITAL_COMMUNITY): Payer: Self-pay | Admitting: Interventional Radiology

## 2015-08-25 DIAGNOSIS — I771 Stricture of artery: Secondary | ICD-10-CM

## 2015-09-06 ENCOUNTER — Ambulatory Visit (HOSPITAL_COMMUNITY)
Admission: RE | Admit: 2015-09-06 | Discharge: 2015-09-06 | Disposition: A | Discharge status: Home / Self Care | Payer: Medicare HMO | Source: Ambulatory Visit | Attending: Interventional Radiology | Admitting: Interventional Radiology

## 2015-09-06 DIAGNOSIS — I771 Stricture of artery: Secondary | ICD-10-CM

## 2015-09-06 HISTORY — PX: IR GENERIC HISTORICAL: IMG1180011

## 2015-09-08 ENCOUNTER — Encounter (HOSPITAL_COMMUNITY): Payer: Self-pay | Admitting: Interventional Radiology

## 2016-10-17 ENCOUNTER — Other Ambulatory Visit (HOSPITAL_COMMUNITY): Payer: Self-pay | Admitting: Interventional Radiology

## 2016-10-17 DIAGNOSIS — I771 Stricture of artery: Secondary | ICD-10-CM

## 2016-10-25 ENCOUNTER — Ambulatory Visit (HOSPITAL_COMMUNITY): Payer: Medicare Other

## 2016-10-25 ENCOUNTER — Ambulatory Visit (HOSPITAL_COMMUNITY)
Admission: RE | Admit: 2016-10-25 | Discharge: 2016-10-25 | Disposition: A | Discharge status: Home / Self Care | Payer: Medicare Other | Source: Ambulatory Visit | Attending: Interventional Radiology | Admitting: Interventional Radiology

## 2016-10-25 DIAGNOSIS — I771 Stricture of artery: Secondary | ICD-10-CM | POA: Insufficient documentation

## 2016-10-25 DIAGNOSIS — I6502 Occlusion and stenosis of left vertebral artery: Secondary | ICD-10-CM | POA: Insufficient documentation

## 2016-10-25 LAB — CREATININE, SERUM
Creatinine, Ser: 0.8 mg/dL (ref 0.44–1.00)
GFR calc Af Amer: >60 mL/min (ref >60)
GFR calc non Af Amer: >60 mL/min (ref >60)

## 2016-10-25 MED ORDER — GADOBENATE DIMEGLUMINE 529 MG/ML IV SOLN
17.0000 mL | Freq: Once | INTRAVENOUS | Status: AC
  Administered 2016-10-25: 17 mL via INTRAVENOUS

## 2016-11-15 ENCOUNTER — Telehealth (HOSPITAL_COMMUNITY): Payer: Self-pay

## 2016-11-15 NOTE — Telephone Encounter (Signed)
Pt called and stated that she has been having headaches. She just had her f/u MRI/MRA. Dr. Corliss Skainseveshwar reviewed her recent scan and stated that there was no explanation for headaches on mri. It is stable. Pt will need to f/u with PCP regarding headaches. Pt agreed with this plan. She also agreed to f/u in 1 yr with MRI/MRA. AW

## 2017-10-08 ENCOUNTER — Other Ambulatory Visit (HOSPITAL_COMMUNITY): Payer: Self-pay | Admitting: Interventional Radiology

## 2017-10-08 DIAGNOSIS — I771 Stricture of artery: Secondary | ICD-10-CM

## 2017-10-14 ENCOUNTER — Other Ambulatory Visit (HOSPITAL_COMMUNITY): Payer: Self-pay | Admitting: Interventional Radiology

## 2017-10-14 DIAGNOSIS — I771 Stricture of artery: Secondary | ICD-10-CM

## 2017-10-21 ENCOUNTER — Ambulatory Visit (HOSPITAL_COMMUNITY)
Admission: RE | Admit: 2017-10-21 | Discharge: 2017-10-21 | Disposition: A | Discharge status: Home / Self Care | Payer: Medicare Other | Source: Ambulatory Visit | Attending: Interventional Radiology | Admitting: Interventional Radiology

## 2017-10-21 ENCOUNTER — Encounter (HOSPITAL_COMMUNITY): Payer: Self-pay

## 2017-10-21 DIAGNOSIS — I771 Stricture of artery: Secondary | ICD-10-CM

## 2017-10-21 DIAGNOSIS — I639 Cerebral infarction, unspecified: Secondary | ICD-10-CM | POA: Insufficient documentation

## 2017-10-21 DIAGNOSIS — I6509 Occlusion and stenosis of unspecified vertebral artery: Secondary | ICD-10-CM | POA: Insufficient documentation

## 2017-10-21 DIAGNOSIS — G939 Disorder of brain, unspecified: Secondary | ICD-10-CM | POA: Insufficient documentation

## 2017-10-22 ENCOUNTER — Ambulatory Visit (HOSPITAL_COMMUNITY)
Admission: RE | Admit: 2017-10-22 | Discharge: 2017-10-22 | Disposition: A | Discharge status: Home / Self Care | Payer: Medicare Other | Source: Ambulatory Visit | Attending: Interventional Radiology | Admitting: Interventional Radiology

## 2017-10-22 DIAGNOSIS — I771 Stricture of artery: Secondary | ICD-10-CM

## 2017-10-22 NOTE — Progress Notes (Signed)
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  Referring Physician(s): Curt Bears  Supervising Physician: Julieanne Cotton  Patient Status: [facility] - Out-pt  History of Present Illness: Kendra Acevedo is a 67 y.o. female with a past medical history of hypertension, hyperlipidemia, and diabetes mellitus. She is known to [facility] and has been followed by Dr. Corliss Skains since 01/2015. She was first referred to [place] by Dr. Antonietta Barcelona for management of left vertebral artery occlusion and associated symptoms. She first consulted with Dr. Corliss Skains 01/31/2015 to discuss vertebrobasilar ischemic symptoms. She underwent an image-guided diagnostic cerebral angiogram 02/10/2015 with Dr. Corliss Skains which revealed an occluded left vertebral artery with associated collaterals, right vertebrobasilar stenosis, and a right ICA superior hypophyseal aneurysm. She then consulted with Dr. Corliss Skains 03/17/2015 when it was determined that she would pursue conservative management including monitoring with routine imaging scans. She was last seen in consultation with Dr. Corliss Skains 09/06/2015.  MRI/MRA brain/head 10/21/2017: 1. No acute or interval insult. 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 2. Chronically occluded left vertebral artery. Retrograde flow back to left PICA. Mild stenosis of the left PCA, not progressive.  Patient presents today to discuss findings of her recent MRI/MRA examination from 10/21/2017. Patient awake and alert sitting in chair. Accompanied by daughter. Patient states that all of her symptoms have worsened. Complains of headaches that are lasting longer than usual. Complains of worsening speech difficulty. States she has trouble with word recall, and sometimes will stop mid-sentence because she does not remember what she is saying.  Daughter reports that patient is also repeating more often. Complains of right-sided weakness, stable at this time. Complains of worsening gait issues. States that she is stumbling more and feels unstable. Complains of numbness/tingling of bilateral feet. States that this is due to her peripheral neuropathy. Complains of constant bilateral tinnitus that she describes as a "buzzing" noise. Denies dizziness, vision changes, or hearing changes.  Patient is currently taking Aspirin 325 mg once daily.   Past Medical History:  Diagnosis Date  . Diabetes mellitus (HCC)   . HTN (hypertension)   . Hyperlipidemia   . Hypothyroidism   . Vertebrobasilar artery stenosis     Past Surgical History:  Procedure Laterality Date  . IR GENERIC HISTORICAL  09/06/2015   IR RADIOLOGIST EVAL & MGMT 09/06/2015 [facility]-INTERV RAD    Allergies: Hydrocodone-acetaminophen; Latex; Plavix [clopidogrel bisulfate]; Codeine; and Yellow dye  Medications: Prior to Admission medications   Medication Sig Start Date End Date Taking? Authorizing Provider  amitriptyline (ELAVIL) 50 MG tablet Take 50 mg by mouth at bedtime.    [provider]  aspirin EC 81 MG tablet Take 81 mg by mouth daily.    [provider]  b complex vitamins tablet Take 1 tablet by mouth daily.    [provider]  cycloSPORINE (RESTASIS) 0.05 % ophthalmic emulsion Place 1 drop into both eyes 2 (two) times daily.    [provider]  hydrochlorothiazide (HYDRODIURIL) 25 MG tablet Take 12.5 mg by mouth daily.    [provider]  levETIRAcetam (KEPPRA) 500 MG tablet Take 500-1,000 mg by mouth daily.     [provider]  levothyroxine (SYNTHROID, LEVOTHROID) 50 MCG tablet Take 50 mcg by mouth daily before breakfast.     [provider]  lisinopril (PRINIVIL,ZESTRIL) 20 MG tablet Take 10 mg by mouth at bedtime.    [provider]  metFORMIN (GLUCOPHAGE) 1000 MG tablet Take 500-1,000 mg  by mouth 2 (two) times daily with a meal. Take 1000 mg every morning and 500 mg every evening 12/26/14   [provider]  Multiple Vitamin (MULTIVITAMIN) capsule Take 1 capsule by mouth daily.     [provider]  simvastatin (ZOCOR) 40 MG tablet Take 40 mg by mouth every evening.    [provider]  sodium chloride (OCEAN) 0.65 % SOLN nasal spray Place 1 spray into both nostrils as needed for congestion.    [provider]  tiZANidine (ZANAFLEX) 4 MG tablet Take 4 mg by mouth at bedtime.    [provider]  vitamin B-12 (CYANOCOBALAMIN) 1000 MCG tablet Take 2,000 mcg by mouth daily.     [provider]     No family history on file.  Social History   Socioeconomic History  . Marital status: Widowed    Spouse name: Not on file  . Number of children: Not on file  . Years of education: Not on file  . Highest education level: Not on file  Occupational History  . Not on file  Social Needs  . Financial resource strain: Not on file  . Food insecurity:    Worry: Not on file    Inability: Not on file  . Transportation needs:    Medical: Not on file    Non-medical: Not on file  Tobacco Use  . Smoking status: Not on file  Substance and Sexual Activity  . Alcohol use: Not on file  . Drug use: Not on file  . Sexual activity: Not on file  Lifestyle  . Physical activity:    Days per week: Not on file    Minutes per session: Not on file  . Stress: Not on file  Relationships  . Social connections:    Talks on phone: Not on file    Gets together: Not on file    Attends religious service: Not on file    Active member of club or organization: Not on file    Attends meetings of clubs or organizations: Not on file    Relationship status: Not on file  Other Topics Concern  . Not on file  Social History Narrative  . Not on file     Review of Systems: A 12 point ROS discussed and pertinent positives are indicated in the HPI above.   All other systems are negative.  Review of Systems  Constitutional: Negative for chills and fever.  HENT: Positive for tinnitus. Negative for hearing loss.   Eyes: Negative for visual disturbance.  Respiratory: Negative for shortness of breath and wheezing.   Cardiovascular: Negative for chest pain and palpitations.  Musculoskeletal: Positive for gait problem.  Neurological: Positive for speech difficulty, weakness, numbness and headaches. Negative for dizziness.  Psychiatric/Behavioral: Negative for behavioral problems and confusion.    Vital Signs: There were no vitals taken for this visit.  Physical Exam  Constitutional: She is oriented to person, place, and time. She appears well-developed and well-nourished. No distress.  Pulmonary/Chest: Effort normal. No respiratory distress.  Neurological: She is alert and oriented to person, place, and time.  Skin: Skin is warm and dry.  Psychiatric: She has a normal mood and affect. Her behavior is normal. Judgment and thought content normal.     Imaging: Mr Shirlee Latch ZO Contrast  Result Date: 10/21/2017 CLINICAL DATA:  Followup vertebrobasilar stenosis and right ICA aneurysm. EXAM: MRI HEAD WITHOUT CONTRAST  MRA HEAD WITHOUT CONTRAST TECHNIQUE: Multiplanar, multiecho pulse sequences of the brain and surrounding structures were obtained without intravenous contrast. Angiographic images of the head were obtained using MRA technique without contrast. COMPARISON:  10/25/2016 FINDINGS: MRI HEAD FINDINGS Brain: Diffusion imaging does not show any acute or subacute infarction. The brainstem is normal. There are a few old small vessel cerebellar infarctions. Cerebral hemispheres show mild small vessel ischemic changes of the deep and subcortical white matter. No cortical or large vessel territory infarction. No mass lesion, hemorrhage, hydrocephalus or extra-axial collection. Vascular: No acute vascular finding. Skull and upper cervical spine: Negative  Sinuses/Orbits: Clear/normal Other: None MRA HEAD FINDINGS Both internal carotid arteries are widely patent through the skull base and siphon regions. No discernible aneurysm by MR. The anterior and middle cerebral vessels are patent without proximal stenosis, aneurysm or vascular malformation. The left vertebral artery is occluded at the foramen magnum level. The right vertebral artery is patent to the basilar. There is retrograde flow in the left vertebral artery back to PICA. Right PICA appears normal. No basilar stenosis. Superior cerebellar arteries are patent. Both posterior cerebral arteries are patent. Mild stenosis of the left P1 segment is unchanged. IMPRESSION: No acute or interval insult. 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 Chronically occluded left vertebral artery. Retrograde flow back to left PICA. Mild stenosis of the left PCA, not progressive. Electronically Signed   By: Paulina Fusi M.D.   On: 10/21/2017 22:01   Mr Brain Wo Contrast  Result Date: 10/21/2017 CLINICAL DATA:  Followup vertebrobasilar stenosis and right ICA aneurysm. EXAM: MRI HEAD WITHOUT CONTRAST MRA HEAD WITHOUT CONTRAST TECHNIQUE: Multiplanar, multiecho pulse sequences of the brain and surrounding structures were obtained without intravenous contrast. Angiographic images of the head were obtained using MRA technique without contrast. COMPARISON:  10/25/2016 FINDINGS: MRI HEAD FINDINGS Brain: Diffusion imaging does not show any acute or subacute infarction. The brainstem is normal. There are a few old small vessel cerebellar infarctions. Cerebral hemispheres show mild small vessel ischemic changes of the deep and subcortical white matter. No cortical or large vessel territory infarction. No mass lesion, hemorrhage, hydrocephalus or extra-axial collection. Vascular: No acute vascular finding. Skull and upper cervical spine: Negative Sinuses/Orbits: Clear/normal Other:  None MRA HEAD FINDINGS Both internal carotid arteries are widely patent through the skull base and siphon regions. No discernible aneurysm by MR. The anterior and middle cerebral vessels are patent without proximal stenosis, aneurysm or vascular malformation. The left vertebral artery is occluded at the foramen magnum level. The right vertebral artery is patent to the basilar. There is retrograde flow in the left vertebral artery back to PICA. Right PICA appears normal. No basilar stenosis. Superior cerebellar arteries are patent. Both posterior cerebral arteries are patent. Mild stenosis of the left P1 segment is unchanged. IMPRESSION: No acute or interval insult. 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 Chronically occluded left vertebral artery. Retrograde flow back to left PICA. Mild stenosis of the left PCA, not progressive. Electronically Signed   By: Paulina Fusi M.D.   On: 10/21/2017 22:01    Labs:  CBC: No results for input(s): WBC, HGB, HCT, PLT in the last 8760 hours.  COAGS: No results for input(s): INR, APTT in the last 8760 hours.  BMP: Recent Labs    10/25/16 1350  CREATININE 0.80  GFRNONAA >60  GFRAA >60    LIVER FUNCTION TESTS: No results for input(s): BILITOT,  AST, ALT, ALKPHOS, PROT, ALBUMIN in the last 8760 hours.  TUMOR MARKERS: No results for input(s): AFPTM, CEA, CA199, CHROMGRNA in the last 8760 hours.  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 Reviewed imaging with patient and daughter. Explained that her MRI/MRA is grossly stable compared to that of 2018 and 2017. First brought to her attention was her right ICA superior hypophyseal aneurysm, stable at this time. Next brought to their attention were her bilateral carotid arteries. Explained that on imaging, it looks like she has bilateral carotid artery stenosis. However, Dr.  Corliss Skains believes that this might be related to artifact, as the areas of stenosis are identical bilaterally. Explained that the best course of management at this time is to obtain a carotid ultrasound to see if her suspected bilateral carotid stenosis is true or artifact. Patient asks if her neurologist, Dr.Tonuzi, can order this ultrasound so she can have this test done at [facility]. Informed patient to call her neurologist for this.  Discussed patient's diabetes mellitus. Advised patient to follow-up with PCP regularly for diabetes control.  Discussed patient's memory issues. Explained that there is nothing on imaging at this time that could explain patient's worsening memory. Advised patient to follow-up with her neurologist regarding memory issues.   Plan for follow-up with an Ultrasound carotids bilaterally ASAP- to be ordered by Dr. Antonietta Barcelona to be done at [facility]. Instructed patient to continue taking Aspirin 325 mg once daily.  All questions answered and concerns addressed. Patient and daughter convey understanding and agree with plan.  Thank you for this interesting consult.  I greatly enjoyed meeting [place] Dalporto and look forward to participating in their care.  A copy of this report was sent to the requesting provider on this date.  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

## 2018-01-23 IMAGING — MR MR MRA HEAD W/O CM
10 of 13 series · 30 of 48 positions shown · IV contrast (multihance)
Comparison: CTA head and neck 06/07/2015.

CLINICAL DATA: Arterial stenosis. Left vertebral artery occlusion.
Vertebrobasilar ischemic symptoms. Confusion, tremors, difficulty
walking, blurry vision, and numbness/ weakness in the right-sided
extremities

EXAM:
MRI HEAD WITHOUT AND WITH CONTRAST
MRA HEAD WITHOUT CONTRAST
TECHNIQUE: Multiplanar, multiecho pulse sequences of the brain and surrounding
structures were obtained without and with intravenous contrast.
Angiographic images of the head were obtained using MRA technique
without contrast.
CONTRAST:  17mL MULTIHANCE GADOBENATE DIMEGLUMINE 529 MG/ML IV SOLN

[Series 3: T1 · sagittal · 5.0mm · 0.47mm/px · 1 of 23 slices shown]
[im 1/23]
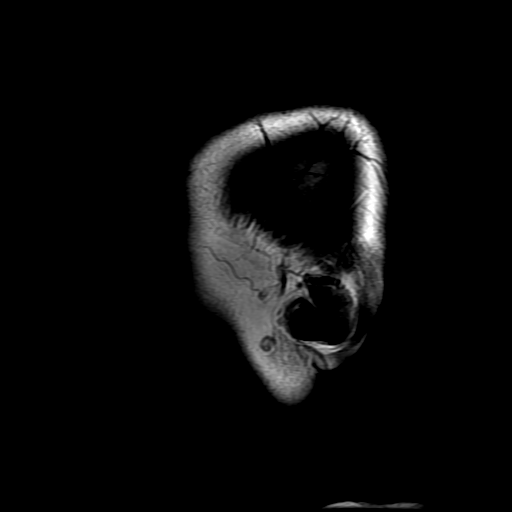

[Series 4: DWI · axial · 3.0mm · 1.09mm/px · z∈[-50,+88]mm · 7 of 94 slices shown (1 of 4)]
[im 1/94]
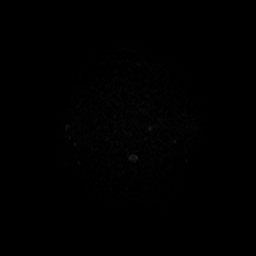
[im 16/94]
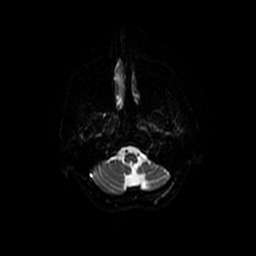
[im 32/94]
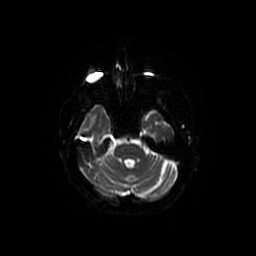
[im 47/94]
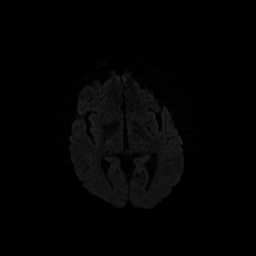
[im 63/94]
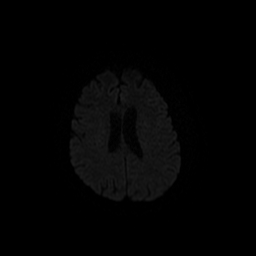
[im 78/94]
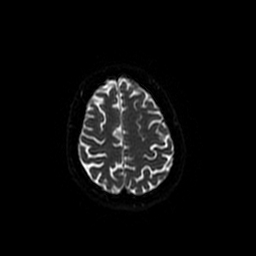
[im 94/94]
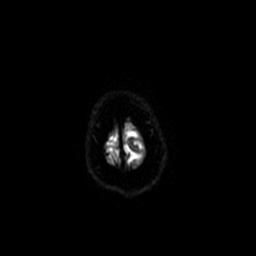

[Series 5: DWI · coronal · 5.0mm · 1.09mm/px · 5 of 66 slices shown (2 of 4)]
[im 1/66]
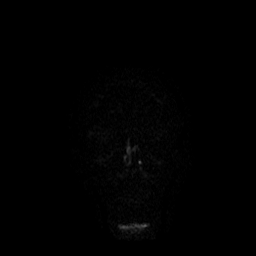
[im 17/66]
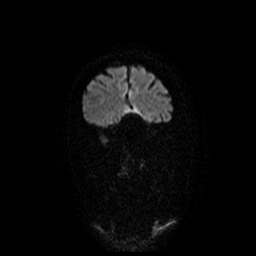
[im 33/66]
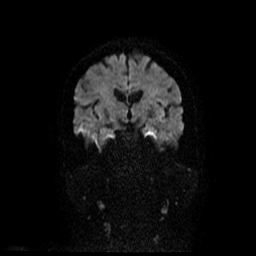
[im 49/66]
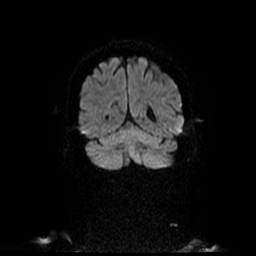
[im 66/66]
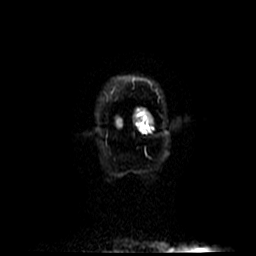

[Series 6: T2 · axial · 5.0mm · 0.43mm/px · z∈[-49,+89]mm · 2 of 24 slices shown]
[im 1/24]
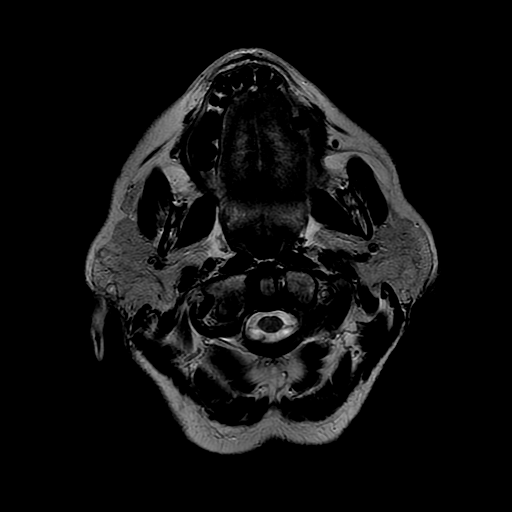
[im 24/24]
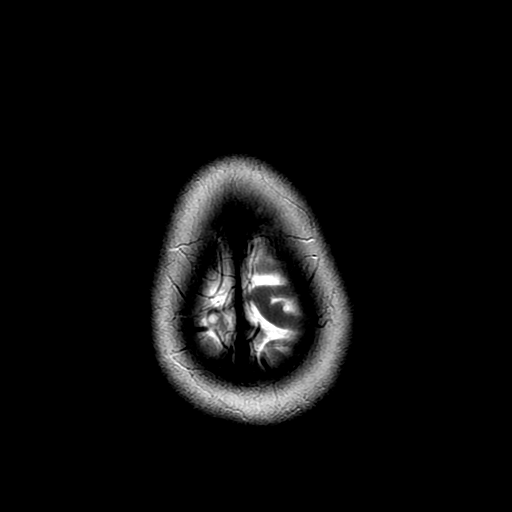

[Series 7: FLAIR · axial · 5.0mm · 0.43mm/px · z∈[-49,+89]mm · 2 of 24 slices shown]
[im 1/24]
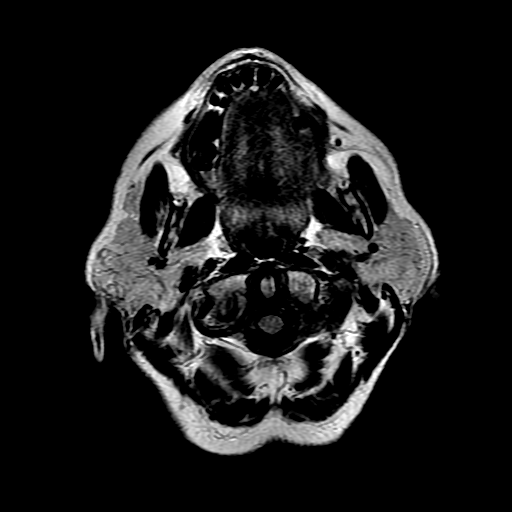
[im 24/24]
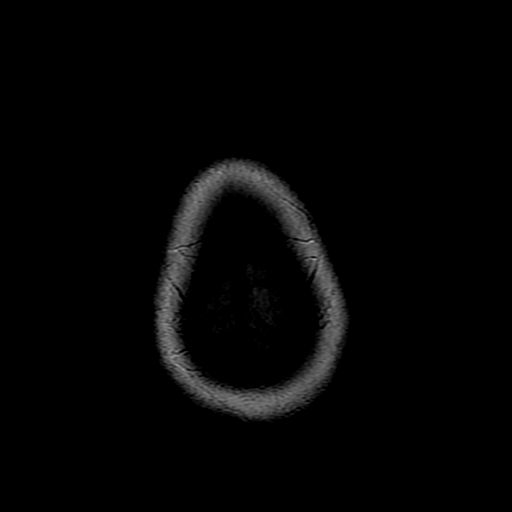

[Series 8: (id) mt fs · axial · 1.4mm · 0.43mm/px · z∈[-44,-2]mm · 4 of 136 slices shown]
[im 1/136]
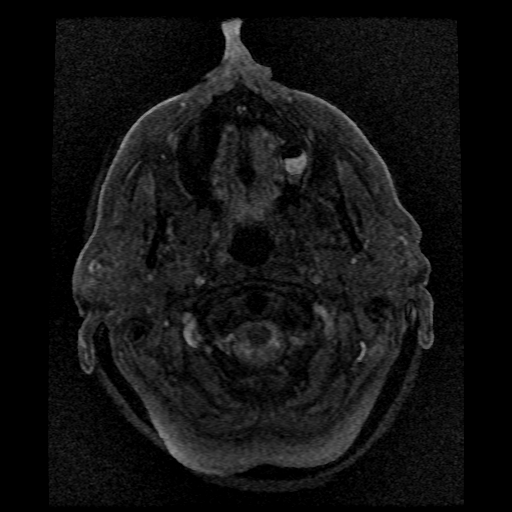
[im 16/136]
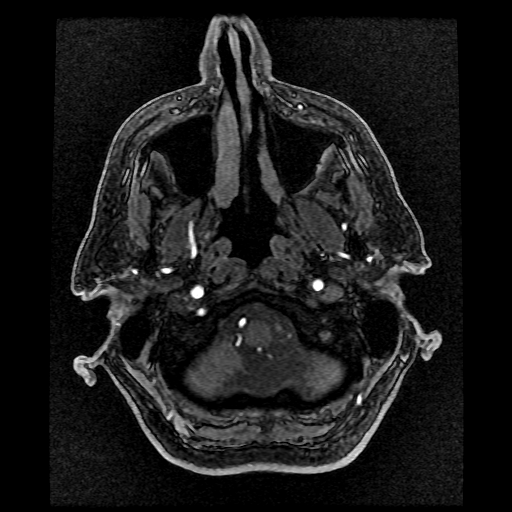
[im 46/136]
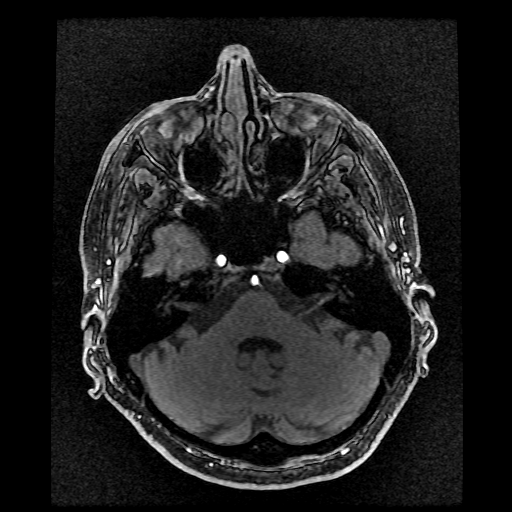
[im 61/136]
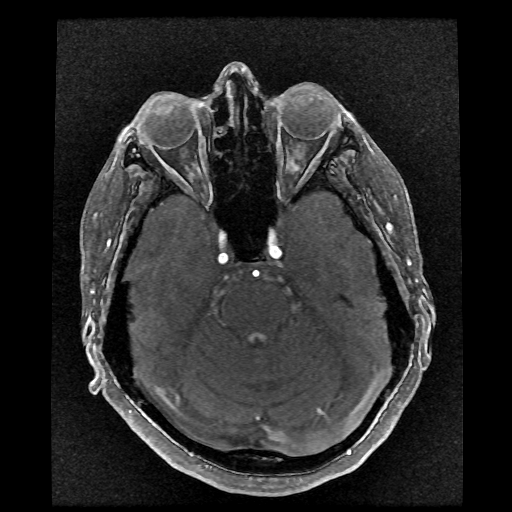

[Series 11: T2 post-contrast · coronal · 5.0mm · 0.39mm/px · 2 of 25 slices shown]
[im 1/25]
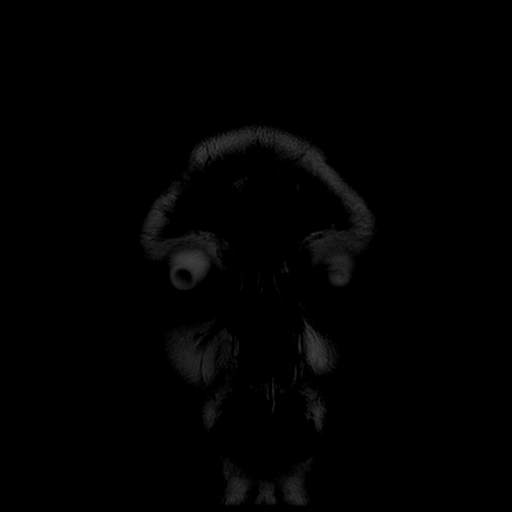
[im 25/25]
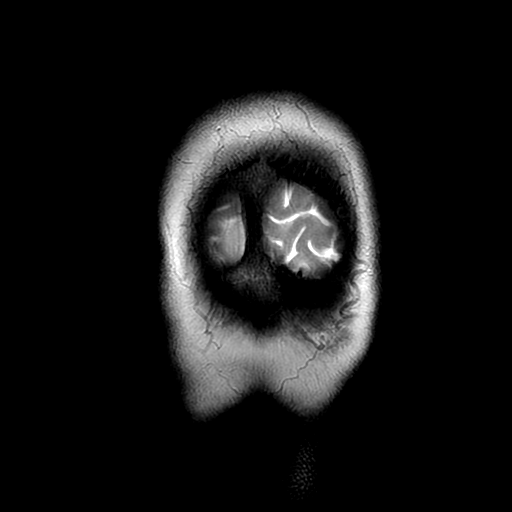

[Series 13: T1 post-contrast · coronal · 5.0mm · 0.39mm/px · 2 of 25 slices shown]
[im 1/25]
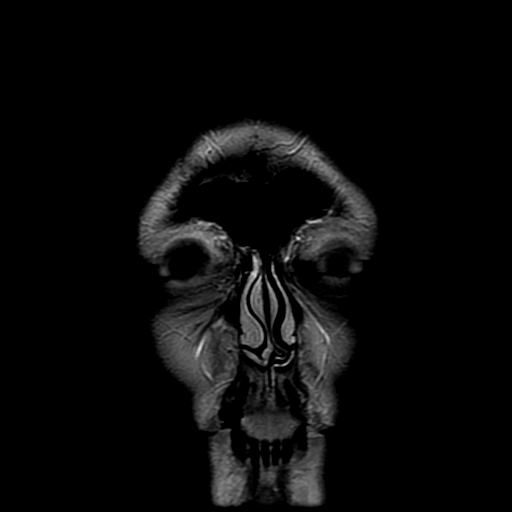
[im 25/25]
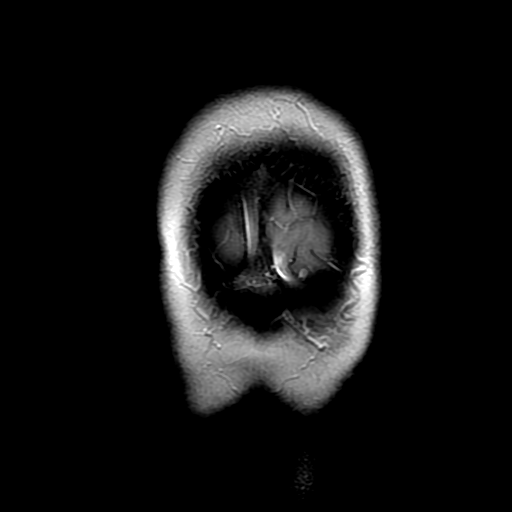

[Series 400: DWI · axial · 3.0mm · 1.09mm/px · z∈[-50,+88]mm · 3 of 47 slices shown (3 of 4)]
[im 1/47]
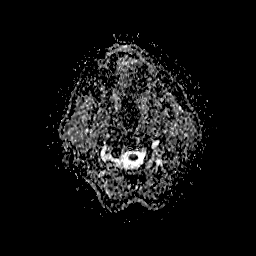
[im 24/47]
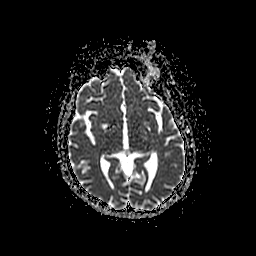
[im 47/47]
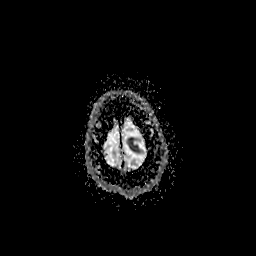

[Series 500: DWI · coronal · 5.0mm · 1.09mm/px · 2 of 33 slices shown (4 of 4)]
[im 1/33]
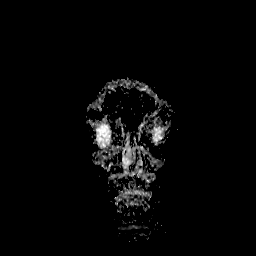
[im 33/33]
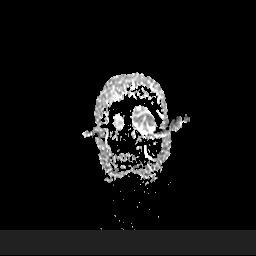

[30 of 48 positions shown; findings below may reference images not displayed]

FINDINGS: MRI HEAD FINDINGS

There is no evidence of acute infarct, intracranial hemorrhage,
mass, midline shift, or extra-axial fluid collection. The ventricles
and sulci are within normal limits for age. Small foci of T2
hyperintensity in the subcortical and deep cerebral white matter are
nonspecific but compatible with mild chronic small vessel ischemic
disease. There are small chronic left cerebellar infarcts. No
abnormal enhancement is identified.

Orbits are unremarkable. A small left maxillary sinus mucous
retention cyst is noted. The mastoid air cells are clear. Abnormal
appearance of the distal left vertebral artery is consistent with
known occlusion. The other major intracranial vascular flow voids
are preserved. No focal osseous lesion is identified.

MRA HEAD FINDINGS

The visualized distal right vertebral artery is patent and supplies
the basilar. The proximal left V4 segment is occluded. There is
retrograde flow in the more distal left V4 segment to the level of
the PICA origin. Right PICA appears patent although its origin was
not imaged. SCA origins are patent. Basilar artery is widely patent.
There is a small patent left posterior communicating artery. PCAs
are patent without evidence of significant stenosis.

The internal carotid arteries are patent from skullbase to carotid
termini without evidence of stenosis. ACAs and MCAs are patent
without evidence of major branch occlusion or significant proximal
stenosis. No intracranial aneurysm is identified. The contour of the
right supraclinoid ICA is unchanged from the prior CTA with mild
irregularity and caliber change potentially reflecting
atherosclerosis as suggested on CTA rather than aneurysm.
IMPRESSION: 1. Unchanged left vertebral artery occlusion. Widely patent distal
right vertebral artery.
2. No significant intracranial arterial stenosis.
3. Mild chronic small vessel ischemic disease and chronic cerebellar
infarcts.

## 2018-07-07 ENCOUNTER — Other Ambulatory Visit (HOSPITAL_COMMUNITY): Payer: Self-pay | Admitting: Interventional Radiology

## 2018-07-07 DIAGNOSIS — I729 Aneurysm of unspecified site: Secondary | ICD-10-CM

## 2018-07-07 DIAGNOSIS — I771 Stricture of artery: Secondary | ICD-10-CM

## 2018-07-07 DIAGNOSIS — I6502 Occlusion and stenosis of left vertebral artery: Secondary | ICD-10-CM

## 2018-07-20 ENCOUNTER — Telehealth (HOSPITAL_COMMUNITY): Payer: Self-pay

## 2018-07-20 NOTE — Telephone Encounter (Signed)
Returned pt's call, no answer, left vm. AW  

## 2018-07-23 ENCOUNTER — Ambulatory Visit (HOSPITAL_COMMUNITY)
Admission: RE | Admit: 2018-07-23 | Discharge: 2018-07-23 | Disposition: A | Discharge status: Home / Self Care | Payer: Medicare Other | Source: Ambulatory Visit | Attending: Interventional Radiology | Admitting: Interventional Radiology

## 2018-07-23 ENCOUNTER — Telehealth (HOSPITAL_COMMUNITY): Payer: Self-pay

## 2018-07-23 ENCOUNTER — Other Ambulatory Visit: Payer: Self-pay

## 2018-07-23 DIAGNOSIS — I771 Stricture of artery: Secondary | ICD-10-CM | POA: Insufficient documentation

## 2018-07-23 DIAGNOSIS — I729 Aneurysm of unspecified site: Secondary | ICD-10-CM | POA: Diagnosis not present

## 2018-07-23 DIAGNOSIS — I6502 Occlusion and stenosis of left vertebral artery: Secondary | ICD-10-CM | POA: Diagnosis present

## 2018-07-23 NOTE — Progress Notes (Signed)
Carotid artery duplex completed. Refer to "CV Proc" under chart review to view preliminary results.  07/23/2018 9:20 AM Maudry Mayhew, MHA, RVT, RDCS, RDMS

## 2018-07-23 NOTE — Telephone Encounter (Signed)
Pt agreed to f/u in 6 months with us carotid. AW 

## 2019-01-19 ENCOUNTER — Telehealth (HOSPITAL_COMMUNITY): Payer: Self-pay

## 2019-01-19 ENCOUNTER — Other Ambulatory Visit (HOSPITAL_COMMUNITY): Payer: Self-pay | Admitting: Interventional Radiology

## 2019-01-19 DIAGNOSIS — I771 Stricture of artery: Secondary | ICD-10-CM

## 2019-01-19 NOTE — Telephone Encounter (Signed)
Called to schedule us carotid, no answer, left vm. AW  

## 2019-02-02 ENCOUNTER — Ambulatory Visit (HOSPITAL_COMMUNITY)
Admission: RE | Admit: 2019-02-02 | Discharge: 2019-02-02 | Disposition: A | Discharge status: Home / Self Care | Payer: Medicare Other | Source: Ambulatory Visit | Attending: Interventional Radiology | Admitting: Interventional Radiology

## 2019-02-02 ENCOUNTER — Other Ambulatory Visit: Payer: Self-pay

## 2019-02-02 DIAGNOSIS — I771 Stricture of artery: Secondary | ICD-10-CM | POA: Insufficient documentation

## 2019-02-02 NOTE — Progress Notes (Signed)
Carotid duplex has been completed.   Preliminary results in CV Proc.   Blanch Media 02/02/2019 9:13 AM

## 2019-02-03 ENCOUNTER — Telehealth (HOSPITAL_COMMUNITY): Payer: Self-pay

## 2019-02-03 NOTE — Telephone Encounter (Signed)
Pt agreed to f/u in 6 months with us carotid. AW 

## 2019-07-05 ENCOUNTER — Telehealth (HOSPITAL_COMMUNITY): Payer: Self-pay

## 2019-07-05 ENCOUNTER — Other Ambulatory Visit (HOSPITAL_COMMUNITY): Payer: Self-pay | Admitting: Interventional Radiology

## 2019-07-05 NOTE — Telephone Encounter (Signed)
Called to schedule US carotid. Pt says she recently went to see neurology and they recommended an mri or diagnostic angiogram since it's been a few years since she's had one. I told her I will send a message to the PA/Deveshwar and get back with her. Pt agreed with this plan. AW

## 2019-07-06 ENCOUNTER — Telehealth (HOSPITAL_COMMUNITY): Payer: Self-pay

## 2019-07-06 ENCOUNTER — Other Ambulatory Visit (HOSPITAL_COMMUNITY): Payer: Self-pay | Admitting: Interventional Radiology

## 2019-07-06 DIAGNOSIS — I771 Stricture of artery: Secondary | ICD-10-CM

## 2019-07-06 NOTE — Telephone Encounter (Signed)
Called to schedule angiogram, no answer, left vm. AW 

## 2019-07-06 NOTE — Telephone Encounter (Signed)
-----   Message from Baird Lyons, [place] sent at 07/05/2019  2:53 PM EDT ----- Regarding: RE: Follow-up Ash,  I spoke with Dr. Corliss Skains about this. Please schedule for diagnostic cerebral arteriogram instead of carotid US. Please let me know if you have any questions.  Thanks! Ally ----- Message ----- From: Anderson Malta Sent: 07/05/2019   1:46 PM EDT To: Baird Lyons, PA-C Subject: Follow-up                                      Ally,   I called to schedule pt's f/u US carotid. She said that she recently went to see Dr. Antonietta Barcelona, her neurologist and he was wondering if we could do an mri or diagnostic angiogram on her. He says she hasn't had an angio since 2017 and wanted to check with Deveshwar. Please advise.   Thanks, Fara Boros

## 2019-07-08 ENCOUNTER — Telehealth (HOSPITAL_COMMUNITY): Payer: Self-pay

## 2019-07-08 NOTE — Telephone Encounter (Signed)
Called to reschedule angiogram, no answer, left vm. AW  

## 2019-07-16 ENCOUNTER — Ambulatory Visit (HOSPITAL_COMMUNITY): Payer: Medicare Other

## 2019-07-19 ENCOUNTER — Other Ambulatory Visit: Payer: Self-pay | Admitting: Radiology

## 2019-07-20 ENCOUNTER — Other Ambulatory Visit: Payer: Self-pay

## 2019-07-20 ENCOUNTER — Other Ambulatory Visit (HOSPITAL_COMMUNITY): Payer: Self-pay | Admitting: Interventional Radiology

## 2019-07-20 ENCOUNTER — Ambulatory Visit (HOSPITAL_COMMUNITY)
Admission: RE | Admit: 2019-07-20 | Discharge: 2019-07-20 | Disposition: A | Discharge status: Home / Self Care | Payer: Medicare Other | Source: Ambulatory Visit | Attending: Interventional Radiology | Admitting: Interventional Radiology

## 2019-07-20 DIAGNOSIS — I671 Cerebral aneurysm, nonruptured: Secondary | ICD-10-CM | POA: Diagnosis not present

## 2019-07-20 DIAGNOSIS — Z79899 Other long term (current) drug therapy: Secondary | ICD-10-CM | POA: Diagnosis not present

## 2019-07-20 DIAGNOSIS — G450 Vertebro-basilar artery syndrome: Secondary | ICD-10-CM | POA: Insufficient documentation

## 2019-07-20 DIAGNOSIS — E785 Hyperlipidemia, unspecified: Secondary | ICD-10-CM | POA: Diagnosis not present

## 2019-07-20 DIAGNOSIS — E039 Hypothyroidism, unspecified: Secondary | ICD-10-CM | POA: Diagnosis not present

## 2019-07-20 DIAGNOSIS — Z79890 Hormone replacement therapy (postmenopausal): Secondary | ICD-10-CM | POA: Diagnosis not present

## 2019-07-20 DIAGNOSIS — I771 Stricture of artery: Secondary | ICD-10-CM

## 2019-07-20 DIAGNOSIS — I10 Essential (primary) hypertension: Secondary | ICD-10-CM | POA: Diagnosis not present

## 2019-07-20 DIAGNOSIS — Z7982 Long term (current) use of aspirin: Secondary | ICD-10-CM | POA: Diagnosis not present

## 2019-07-20 DIAGNOSIS — Z7984 Long term (current) use of oral hypoglycemic drugs: Secondary | ICD-10-CM | POA: Diagnosis not present

## 2019-07-20 DIAGNOSIS — Z885 Allergy status to narcotic agent status: Secondary | ICD-10-CM | POA: Diagnosis not present

## 2019-07-20 DIAGNOSIS — R0609 Other forms of dyspnea: Secondary | ICD-10-CM | POA: Diagnosis not present

## 2019-07-20 DIAGNOSIS — E119 Type 2 diabetes mellitus without complications: Secondary | ICD-10-CM | POA: Insufficient documentation

## 2019-07-20 HISTORY — PX: IR ANGIO INTRA EXTRACRAN SEL COM CAROTID INNOMINATE BILAT MOD SED: IMG5360

## 2019-07-20 HISTORY — PX: IR US GUIDE VASC ACCESS RIGHT: IMG2390

## 2019-07-20 HISTORY — PX: IR ANGIO VERTEBRAL SEL VERTEBRAL UNI R MOD SED: IMG5368

## 2019-07-20 LAB — BASIC METABOLIC PANEL
Anion gap: 12 (ref 5–15)
BUN: 6 mg/dL — ABNORMAL LOW (ref 8–23)
CO2: 25 mmol/L (ref 22–32)
Calcium: 9.6 mg/dL (ref 8.9–10.3)
Chloride: 103 mmol/L (ref 98–111)
Creatinine, Ser: 0.82 mg/dL (ref 0.44–1.00)
GFR calc Af Amer: >60 mL/min (ref >60)
GFR calc non Af Amer: >60 mL/min (ref >60)
Glucose, Bld: 161 mg/dL — ABNORMAL HIGH (ref 70–99)
Potassium: 4.1 mmol/L (ref 3.5–5.1)
Sodium: 140 mmol/L (ref 135–145)

## 2019-07-20 LAB — CBC
HCT: 40.9 % (ref 36.0–46.0)
Hemoglobin: 13.3 g/dL (ref 12.0–15.0)
MCH: 31.1 pg (ref 26.0–34.0)
MCHC: 32.5 g/dL (ref 30.0–36.0)
MCV: 95.8 fL (ref 80.0–100.0)
Platelets: 254 10*3/uL (ref 150–400)
RBC: 4.27 MIL/uL (ref 3.87–5.11)
RDW: 12.5 % (ref 11.5–15.5)
WBC: 5.5 10*3/uL (ref 4.0–10.5)
nRBC: 0 % (ref 0.0–0.2)

## 2019-07-20 LAB — PROTIME-INR
INR: 1.1 (ref 0.8–1.2)
Prothrombin Time: 13.5 seconds (ref 11.4–15.2)

## 2019-07-20 LAB — GLUCOSE, CAPILLARY: Glucose-Capillary: 148 mg/dL — ABNORMAL HIGH (ref 70–99)

## 2019-07-20 MED ORDER — LIDOCAINE HCL 1 % IJ SOLN
INTRAMUSCULAR | Status: AC | PRN
  Administered 2019-07-20: 1 mL

## 2019-07-20 MED ORDER — SODIUM CHLORIDE 0.9 % IV SOLN: INTRAVENOUS | Status: AC

## 2019-07-20 MED ORDER — IOHEXOL 300 MG/ML  SOLN
50.0000 mL | Freq: Once | INTRAMUSCULAR | Status: AC | PRN
  Administered 2019-07-20: 15 mL via INTRA_ARTERIAL

## 2019-07-20 MED ORDER — HEPARIN SODIUM (PORCINE) 1000 UNIT/ML IJ SOLN
INTRAMUSCULAR | Status: AC
  Filled 2019-07-20: qty 1

## 2019-07-20 MED ORDER — VERAPAMIL HCL 2.5 MG/ML IV SOLN
INTRAVENOUS | Status: AC | PRN
  Administered 2019-07-20: 2.5 mg via INTRAVENOUS

## 2019-07-20 MED ORDER — MIDAZOLAM HCL 2 MG/2ML IJ SOLN
INTRAMUSCULAR | Status: AC
  Filled 2019-07-20: qty 2

## 2019-07-20 MED ORDER — HEPARIN SODIUM (PORCINE) 1000 UNIT/ML IJ SOLN
INTRAMUSCULAR | Status: AC | PRN
  Administered 2019-07-20: 2000 [IU] via INTRAVENOUS

## 2019-07-20 MED ORDER — IOHEXOL 300 MG/ML  SOLN
150.0000 mL | Freq: Once | INTRAMUSCULAR | Status: AC | PRN
  Administered 2019-07-20: 70 mL via INTRA_ARTERIAL

## 2019-07-20 MED ORDER — FENTANYL CITRATE (PF) 100 MCG/2ML IJ SOLN
INTRAMUSCULAR | Status: AC
  Filled 2019-07-20: qty 2

## 2019-07-20 MED ORDER — LIDOCAINE HCL 1 % IJ SOLN
INTRAMUSCULAR | Status: AC
  Filled 2019-07-20: qty 20

## 2019-07-20 MED ORDER — NITROGLYCERIN 1 MG/10 ML FOR IR/CATH LAB
INTRA_ARTERIAL | Status: AC | PRN
  Administered 2019-07-20: 200 ug via INTRA_ARTERIAL

## 2019-07-20 MED ORDER — MIDAZOLAM HCL 2 MG/2ML IJ SOLN
INTRAMUSCULAR | Status: AC | PRN
  Administered 2019-07-20: 1 mg via INTRAVENOUS

## 2019-07-20 MED ORDER — FENTANYL CITRATE (PF) 100 MCG/2ML IJ SOLN
INTRAMUSCULAR | Status: AC | PRN
  Administered 2019-07-20: 25 ug via INTRAVENOUS

## 2019-07-20 MED ORDER — SODIUM CHLORIDE 0.9 % IV SOLN: Freq: Once | INTRAVENOUS | Status: AC

## 2019-07-20 MED ORDER — VERAPAMIL HCL 2.5 MG/ML IV SOLN
INTRAVENOUS | Status: AC
  Filled 2019-07-20: qty 2

## 2019-07-20 MED ORDER — NITROGLYCERIN 1 MG/10 ML FOR IR/CATH LAB
INTRA_ARTERIAL | Status: AC
  Filled 2019-07-20: qty 10

## 2019-07-20 NOTE — H&P (Signed)
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  Referring Physician(s): Curt Bears  Supervising Physician: Julieanne Cotton  Patient Status: [facility] - Out-pt  History of Present Illness: Kendra Acevedo is a 69 y.o. female with a past medical history of hypertension, hyperlipidemia, and diabetes mellitus. She is known to [facility] and has been followed by Dr. Corliss Skains since 01/2015. She was first referred to [place] by Dr. 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 She underwent an image-guided diagnostic cerebral arteriogram 02/10/2015 by Dr. 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 She has since been followed by routine imaging scans to monitor for changes. Her most recent follow-up was with a carotid US 02/02/2019. At that time, it was recommended by Dr. Corliss Skains that patient have a repeat carotid US in 6 months. However, it was recommended by her neurologist (Dr. Antonietta Barcelona) that patient undergo a more inclusive imaging study for follow-up (requests MR vs diagnostic cerebral arteriogram).  Diagnostic cerebral arteriogram 02/10/2015: 1. Angiographically occluded left vertebral artery proximally with partial reconstitution of the vessel at the level of mid cervical region from musculoskeletal collaterals from the ascending cervical branch of the thyrocervical trunk. 2. Non opacification of the left vertebral artery at the level of C1. 3. Retrograde opacification of the left vertebrobasilar junction to the left posterior inferior cerebellar artery from the right vertebral artery injection. 4. Suggestion of moderate narrowing of the right vertebrobasilar junction just proximal to the right posterior inferior cerebellar artery. 5. 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  Patient presents today for an image-guided diagnostic cerebral arteriogram for full evaluation of vessels. Patient awake and alert sitting in bed. Complains of intermittent sharp headaches that last "a few minutes" and subside on their own. Complains of difficulty with word finding. Complains of dizziness when "moving too quickly". Complains of DOE, stable at this time. States this has occurred for years now. Denies fever, chills, chest pain, or abdominal pain.   Past Medical History:  Diagnosis Date  . Diabetes mellitus (HCC)   . HTN (hypertension)   . Hyperlipidemia   . Hypothyroidism   . Vertebrobasilar artery stenosis     Past Surgical History:  Procedure Laterality Date  . IR GENERIC HISTORICAL  09/06/2015   IR RADIOLOGIST EVAL & MGMT 09/06/2015 [facility]-INTERV RAD    Allergies: Hydrocodone-acetaminophen, Latex, Other, Plavix [clopidogrel bisulfate], Codeine, and Yellow dye  Medications: Prior to Admission medications   Medication Sig Start Date End Date Taking? Authorizing Provider  amitriptyline (ELAVIL) 50 MG tablet Take 100 mg by mouth See admin instructions. Take 100 mg after supper and 100 mg at bedtime   Yes [provider]  aspirin EC 325 MG tablet Take 325 mg by mouth at bedtime.    Yes [provider]  cholecalciferol (VITAMIN D) 25 MCG (1000 UNIT) tablet Take 1,000 Units by mouth daily.   Yes [provider]  famotidine (PEPCID) 40 MG tablet Take 40 mg by mouth 2 (two) times daily.   Yes [provider]  glimepiride (AMARYL) 4 MG tablet Take 4 mg by mouth daily as needed (Take if blood glucose is over 140 before breakfast).  01/14/19  Yes [provider]  levETIRAcetam (KEPPRA) 500 MG tablet Take 1,000 mg by  mouth daily.    Yes [provider]  levothyroxine (SYNTHROID, LEVOTHROID) 50 MCG tablet Take  50 mcg by mouth daily before breakfast.    Yes [provider]  linaclotide (LINZESS) 290 MCG CAPS capsule Take 290 mcg by mouth daily before breakfast.   Yes [provider]  lisinopril (PRINIVIL,ZESTRIL) 20 MG tablet Take 10 mg by mouth at bedtime.   Yes [provider]  metFORMIN (GLUCOPHAGE) 1000 MG tablet Take 1,000 mg by mouth daily with breakfast.  12/26/14  Yes [provider]  metoprolol tartrate (LOPRESSOR) 50 MG tablet Take 25 mg by mouth 2 (two) times daily.   Yes [provider]  Multiple Vitamin (MULTIVITAMIN) capsule Take 1 capsule by mouth daily.    Yes [provider]  nitrofurantoin (MACRODANTIN) 100 MG capsule Take 100 mg by mouth 2 (two) times daily.   Yes [provider]  simvastatin (ZOCOR) 40 MG tablet Take 40 mg by mouth at bedtime.    Yes [provider]  sodium chloride (OCEAN) 0.65 % SOLN nasal spray Place 1 spray into both nostrils daily as needed for congestion.    Yes [provider]  tiZANidine (ZANAFLEX) 4 MG tablet Take 4 mg by mouth at bedtime.   Yes [provider]  vitamin B-12 (CYANOCOBALAMIN) 1000 MCG tablet Take 2,000 mcg by mouth at bedtime.    Yes [provider]  vitamin E 180 MG (400 UNITS) capsule Take 400 Units by mouth daily.   Yes [provider]     No family history on file.  Social History   Socioeconomic History  . Marital status: Widowed    Spouse name: Not on file  . Number of children: Not on file  . Years of education: Not on file  . Highest education level: Not on file  Occupational History  . Not on file  Tobacco Use  . Smoking status: Not on file  Substance and Sexual Activity  . Alcohol use: Not on file  . Drug use: Not on file  . Sexual activity: Not on file  Other Topics Concern  . Not on file  Social History Narrative  . Not on file   Social Determinants of Health   Financial Resource Strain:   . Difficulty of  Paying Living Expenses:   Food Insecurity:   . Worried About Programme researcher, broadcasting/film/videounning Out of Food in the Last Year:   . Baristaan Out of Food in the Last Year:   Transportation Needs:   . Freight forwarderLack of Transportation (Medical):   Marland Kitchen. Lack of Transportation (Non-Medical):   Physical Activity:   . Days of Exercise per Week:   . Minutes of Exercise per Session:   Stress:   . Feeling of Stress :   Social Connections:   . Frequency of Communication with Friends and Family:   . Frequency of Social Gatherings with Friends and Family:   . Attends Religious Services:   . Active Member of Clubs or Organizations:   . Attends BankerClub or Organization Meetings:   Marland Kitchen. Marital Status:      Review of Systems: A 12 point ROS discussed and pertinent positives are indicated in the HPI above.  All other systems are negative.  Review of Systems  Constitutional: Negative for chills and fever.  Respiratory: Positive for shortness of breath. Negative for wheezing.   Cardiovascular: Negative for chest pain and palpitations.  Gastrointestinal: Negative for abdominal pain.  Neurological: Positive for dizziness, speech difficulty and headaches.  Psychiatric/Behavioral:  Negative for behavioral problems and confusion.    Vital Signs: BP (!) 143/73   Pulse 98   Temp 98 F (36.7 C) (Oral)   Resp 16   Ht 5' 5.5" (1.664 m)   Wt 182 lb 6.4 oz (82.7 kg)   SpO2 95%   BMI 29.89 kg/m   Physical Exam Vitals and nursing note reviewed.  Constitutional:      General: She is not in acute distress.    Appearance: Normal appearance.  Cardiovascular:     Rate and Rhythm: Normal rate and regular rhythm.     Heart sounds: Normal heart sounds. No murmur heard.   Pulmonary:     Effort: Pulmonary effort is normal. No respiratory distress.     Breath sounds: Normal breath sounds.  Skin:    General: Skin is warm and dry.  Neurological:     Mental Status: She is alert and oriented to person, place, and time.      MD Evaluation Airway:  WNL Heart: WNL Abdomen: WNL Chest/ Lungs: WNL ASA  Classification: 3 Mallampati/Airway Score: Two   Imaging: No results found.  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 Plan for image-guided diagnostic cerebral arteriogram today with Dr. Corliss Skains. Patient is NPO. Afebrile. Ok to proceed with Aspirin per Dr. Corliss Skains. INR 1.1 today.  Risks and benefits of diagnostic cerebral angiogram were discussed with the patient including, but not limited to bleeding, infection, vascular injury, stroke, or contrast induced renal failure. This interventional procedure involves the use of X-rays and because of the nature of the planned procedure, it is possible that we will have prolonged use of X-ray fluoroscopy. Potential radiation risks to you include (but are not limited to) the following: - A slightly elevated risk for cancer  several years later in life. This risk is typically less than 0.5% percent. This risk is low in comparison to the normal incidence of human cancer, which is 33% for women and 50% for men according to the American [employer] Society. - Radiation induced injury can include skin redness, resembling a rash, tissue breakdown / ulcers and hair loss (which can be temporary or permanent).  The likelihood of either of these occurring depends on the difficulty of the procedure and whether you are sensitive to radiation due to previous procedures, disease, or genetic conditions.  IF your procedure requires a prolonged use of radiation, you will be notified and given written instructions for further action.  It is your responsibility to  monitor the irradiated area for the 2 weeks following the procedure and to notify your physician if you are concerned that you have suffered a radiation induced injury.   All of the patient's questions were answered, patient is agreeable to proceed. Consent signed and in chart.   Thank you for this interesting consult.  I greatly enjoyed meeting Marly Schuld and look forward to participating in their care.  A copy of this report was sent to the requesting provider on this date.  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

## 2019-07-20 NOTE — Discharge Instructions (Addendum)
Radial Site Care  This sheet gives you information about how to care for yourself after your procedure. Your health care provider may also give you more specific instructions. If you have problems or questions, contact your health care provider. What can I expect after the procedure? After the procedure, it is common to have:  Bruising and tenderness at the catheter insertion area. Follow these instructions at home: Medicines  Take over-the-counter and prescription medicines only as told by your health care provider. Insertion site care  Follow instructions from your health care provider about how to take care of your insertion site. Make sure you: ? Wash your hands with soap and water before you change your bandage (dressing). If soap and water are not available, use hand sanitizer. ? Change your dressing as told by your health care provider. ? Leave stitches (sutures), skin glue, or adhesive strips in place. These skin closures may need to stay in place for 2 weeks or longer. If adhesive strip edges start to loosen and curl up, you may trim the loose edges. Do not remove adhesive strips completely unless your health care provider tells you to do that.  Check your insertion site every day for signs of infection. Check for: ? Redness, swelling, or pain. ? Fluid or blood. ? Pus or a bad smell. ? Warmth.  Do not take baths, swim, or use a hot tub until your health care provider approves.  You may shower 24-48 hours after the procedure, or as directed by your health care provider. ? Remove the dressing and gently wash the site with plain soap and water. ? Pat the area dry with a clean towel. ? Do not rub the site. That could cause bleeding.  Do not apply powder or lotion to the site. Activity   For 24 hours after the procedure, or as directed by your health care provider: ? Do not flex or bend the affected arm. ? Do not push or pull heavy objects with the affected arm. ? Do not  drive yourself home from the hospital or clinic. You may drive 24 hours after the procedure unless your health care provider tells you not to. ? Do not operate machinery or power tools.  Do not lift anything that is heavier than 10 lb (4.5 kg), or the limit that you are told, until your health care provider says that it is safe.  Ask your health care provider when it is okay to: ? Return to work or school. ? Resume usual physical activities or sports. ? Resume sexual activity. General instructions  If the catheter site starts to bleed, raise your arm and put firm pressure on the site. If the bleeding does not stop, get help right away. This is a medical emergency.  If you went home on the same day as your procedure, a responsible adult should be with you for the first 24 hours after you arrive home.  Keep all follow-up visits as told by your health care provider. This is important. Contact a health care provider if:  You have a fever.  You have redness, swelling, or yellow drainage around your insertion site. Get help right away if:  You have unusual pain at the radial site.  The catheter insertion area swells very fast.  The insertion area is bleeding, and the bleeding does not stop when you hold steady pressure on the area.  Your arm or hand becomes pale, cool, tingly, or numb. These symptoms may represent a serious problem   that is an emergency. Do not wait to see if the symptoms will go away. Get medical help right away. Call your local emergency services (911 in the [place] Do not drive yourself to the hospital. Summary  After the procedure, it is common to have bruising and tenderness at the site.  Follow instructions from your health care provider about how to take care of your radial site wound. Check the wound every day for signs of infection.  Do not lift anything that is heavier than 10 lb (4.5 kg), or the limit that you are told, until your health care provider says  that it is safe. This information is not intended to replace advice given to you by your health care provider. Make sure you discuss any questions you have with your health care provider. Document Revised: 01/29/2017 Document Reviewed: 01/29/2017 Elsevier Patient Education  2020 Elsevier Inc. Cerebral Angiogram, Care After This sheet gives you information about how to care for yourself after your procedure. Your health care provider may also give you more specific instructions. If you have problems or questions, contact your health care provider. What can I expect after the procedure? After the procedure, it is common to have:  Bruising and tenderness at the catheter insertion site.  A mild headache. Follow these instructions at home: Insertion site care  Follow instructions from your health care provider about how to take care of the insertion site. Make sure you: ? Wash your hands with soap and water before and after you change your bandage (dressing). If soap and water are not available, use hand sanitizer. ? Change your dressing as told by your health care provider.  Do not take baths, swim, or use a hot tub until your health care provider approves. You may shower 24-48 hours after the procedure, or as told by your health care provider.  To clean your insertion site: ? Gently wash the site with plain soap and water. ? Pat the area dry with a clean towel. ? Do not rub the site. This may cause bleeding.  Do not apply powder or lotion to the site. Keep the site clean and dry. Infection signs Check your incision area every day for signs of infection. Check for:  Redness, swelling, or pain.  Fluid or blood.  Warmth.  Pus or a bad smell.  Activity  Do not drive for 24 hours if you were given a sedative during your procedure.  Rest as told by your health care provider.  Do not lift anything that is heavier than 10 lb (4.5 kg), or the limit that you are told, until your  health care provider says that it is safe.  Return to your normal activities as told by your health care provider, usually in about a week. Ask your health care provider what activities are safe for you. General instructions   If your insertion site starts to bleed, lie flat and put pressure on the site. If the bleeding does not stop, get help right away. This is a medical emergency.  Do not use any products that contain nicotine or tobacco, such as cigarettes, e-cigarettes, and chewing tobacco. If you need help quitting, ask your health care provider.  Take over-the-counter and prescription medicines only as told by your health care provider.  Drink enough fluid to keep your urine pale yellow. This helps flush the contrast dye from your body.  Keep all follow-up visits as directed by your health care provider. This is important. Contact a health   care provider if:  You have a fever or chills.  You have redness, swelling, or pain around your insertion site.  You have fluid or blood coming from your insertion site.  The insertion site feels warm to the touch.  You have pus or a bad smell coming from your insertion site.  You notice blood collecting in the tissue around the insertion site (hematoma). The hematoma may be painful to the touch. Get help right away if:  You have chest pain or trouble breathing.  You have severe pain or swelling at the insertion site.  The insertion area bleeds, and bleeding continues after 30 minutes of holding steady pressure on the site.  The arm or leg where the catheter was inserted is pale, cold, numb, tingling, or weak.  You have a rash.  You have any symptoms of a stroke. "BE FAST" is an easy way to remember the main warning signs of a stroke: ? B - Balance. Signs are dizziness, sudden trouble walking, or loss of balance. ? E - Eyes. Signs are trouble seeing or a sudden change in vision. ? F - Face. Signs are sudden weakness or numbness of  the face, or the face or eyelid drooping on one side. ? A - Arms. Signs are weakness or numbness in an arm. This happens suddenly and usually on one side of the body. ? S - Speech. Signs are sudden trouble speaking, slurred speech, or trouble understanding what people say. ? T - Time. Time to call emergency services. Write down what time symptoms started.  You have other signs of a stroke, such as: ? A sudden, severe headache with no known cause. ? Nausea or vomiting. ? Seizure. These symptoms may represent a serious problem that is an emergency. Do not wait to see if the symptoms will go away. Get medical help right away. Call your local emergency services (911 in the [place] Do not drive yourself to the hospital. Summary  Bruising and tenderness at the insertion site are common.  Follow your health care provider's instructions about caring for your insertion site. Change dressing and clean the area as instructed.  If your insertion site bleeds, apply direct pressure until bleeding stops.  Return to your normal activities as told by your health care provider. Ask what activities are safe.  Rest and drink plenty of fluids. This information is not intended to replace advice given to you by your health care provider. Make sure you discuss any questions you have with your health care provider. Document Revised: 07/14/2018 Document Reviewed: 07/14/2018 Elsevier Patient Education  2020 Elsevier Inc. Moderate Conscious Sedation, Adult Sedation is the use of medicines to promote relaxation and relieve discomfort and anxiety. Moderate conscious sedation is a type of sedation. Under moderate conscious sedation, you are less alert than normal, but you are still able to respond to instructions, touch, or both. Moderate conscious sedation is used during short medical and dental procedures. It is milder than deep sedation, which is a type of sedation under which you cannot be easily woken up. It is also  milder than general anesthesia, which is the use of medicines to make you unconscious. Moderate conscious sedation allows you to return to your regular activities sooner. Tell a health care provider about:  Any allergies you have.  All medicines you are taking, including vitamins, herbs, eye drops, creams, and over-the-counter medicines.  Use of steroids (by mouth or creams).  Any problems you or family members have had with   sedatives and anesthetic medicines.  Any blood disorders you have.  Any surgeries you have had.  Any medical conditions you have, such as sleep apnea.  Whether you are pregnant or may be pregnant.  Any use of cigarettes, alcohol, marijuana, or street drugs. What are the risks? Generally, this is a safe procedure. However, problems may occur, including:  Getting too much medicine (oversedation).  Nausea.  Allergic reaction to medicines.  Trouble breathing. If this happens, a breathing tube may be used to help with breathing. It will be removed when you are awake and breathing on your own.  Heart trouble.  Lung trouble. What happens before the procedure? Staying hydrated Follow instructions from your health care provider about hydration, which may include:  Up to 2 hours before the procedure - you may continue to drink clear liquids, such as water, clear fruit juice, black coffee, and plain tea. Eating and drinking restrictions Follow instructions from your health care provider about eating and drinking, which may include:  8 hours before the procedure - stop eating heavy meals or foods such as meat, fried foods, or fatty foods.  6 hours before the procedure - stop eating light meals or foods, such as toast or cereal.  6 hours before the procedure - stop drinking milk or drinks that contain milk.  2 hours before the procedure - stop drinking clear liquids. Medicine Ask your health care provider about:  Changing or stopping your regular medicines.  This is especially important if you are taking diabetes medicines or blood thinners.  Taking medicines such as aspirin and ibuprofen. These medicines can thin your blood. Do not take these medicines before your procedure if your health care provider instructs you not to.  Tests and exams  You will have a physical exam.  You may have blood tests done to show: ? How well your kidneys and liver are working. ? How well your blood can clot. General instructions  Plan to have someone take you home from the hospital or clinic.  If you will be going home right after the procedure, plan to have someone with you for 24 hours. What happens during the procedure?  An IV tube will be inserted into one of your veins.  Medicine to help you relax (sedative) will be given through the IV tube.  The medical or dental procedure will be performed. What happens after the procedure?  Your blood pressure, heart rate, breathing rate, and blood oxygen level will be monitored often until the medicines you were given have worn off.  Do not drive for 24 hours. This information is not intended to replace advice given to you by your health care provider. Make sure you discuss any questions you have with your health care provider. Document Revised: 12/06/2016 Document Reviewed: 04/15/2015 Elsevier Patient Education  2020 Elsevier Inc.  

## 2019-07-20 NOTE — Progress Notes (Signed)
Patient ID: Kendra Acevedo, female   DOB: 05/08/1950, 69 y.o.   MRN: [id number] Clinical history.  69 year old right-handed lady who presents with symptoms of vertebrobasilar ischemia.  Notably history of dizziness gait imbalance especially with upright posture, and ulcer upon looking up.  She denies any visual symptoms of diplopia, blurred vision or blindness appear she denies any perioral numbness.  She is also noticed occasional symptoms of word finding difficulties.  Patient denies any history of chest pain shortness of breath or breathing difficulties.  She reports her diabetes to be fairly well controlled.  She denies any recent chills fever rigors.  On brief examination patient is alert awake oriented to time place space.  Speech appears within normal limits.  Comprehension is normal. No gross lateralizing neurological symptoms.  Plan.  Diagnostic cerebral arteriogram preferably via right radial approach..  Dr. Corliss Skains MD

## 2019-07-20 NOTE — Procedures (Addendum)
Status post bilateral common carotid artery and right vertebral artery angiograms.  Right radial approach.  Findings.  1 angiographically occluded left vertebral artery with retrograde opacification of the distal left vertebral junction from the right vertebral artery injection.  2.  Approximately 3.3 mm x 1.9 mm wide neck second outpouching arising from the posterior wall of the right ICA at the level of the ophthalmic artery. S.Leonette Tischer MD

## 2020-05-24 ENCOUNTER — Other Ambulatory Visit (HOSPITAL_COMMUNITY): Payer: Self-pay | Admitting: Interventional Radiology

## 2020-05-24 DIAGNOSIS — I771 Stricture of artery: Secondary | ICD-10-CM

## 2020-05-24 DIAGNOSIS — G450 Vertebro-basilar artery syndrome: Secondary | ICD-10-CM

## 2020-05-24 DIAGNOSIS — I671 Cerebral aneurysm, nonruptured: Secondary | ICD-10-CM

## 2020-06-29 ENCOUNTER — Ambulatory Visit (HOSPITAL_COMMUNITY)
Admission: RE | Admit: 2020-06-29 | Discharge: 2020-06-29 | Disposition: A | Discharge status: Home / Self Care | Payer: Medicare Other | Source: Ambulatory Visit | Attending: Interventional Radiology | Admitting: Interventional Radiology

## 2020-06-29 ENCOUNTER — Other Ambulatory Visit: Payer: Self-pay

## 2020-06-29 DIAGNOSIS — I771 Stricture of artery: Secondary | ICD-10-CM | POA: Insufficient documentation

## 2020-06-29 DIAGNOSIS — G450 Vertebro-basilar artery syndrome: Secondary | ICD-10-CM | POA: Diagnosis present

## 2020-06-29 DIAGNOSIS — I671 Cerebral aneurysm, nonruptured: Secondary | ICD-10-CM

## 2020-07-03 ENCOUNTER — Telehealth (HOSPITAL_COMMUNITY): Payer: Self-pay

## 2020-07-03 NOTE — Telephone Encounter (Signed)
Pt agreed to f/u in 1 year with [facility]. - had questions about imaging results. Sent a message to our PA to advise. AW

## 2021-06-25 ENCOUNTER — Telehealth (HOSPITAL_COMMUNITY): Payer: Self-pay

## 2021-06-25 NOTE — Telephone Encounter (Signed)
Returned call to pt to let her know that we are waiting on insurance approval. I will call to schedule once it's been received. AW

## 2021-06-26 ENCOUNTER — Other Ambulatory Visit (HOSPITAL_COMMUNITY): Payer: Self-pay | Admitting: Interventional Radiology

## 2021-06-26 DIAGNOSIS — I771 Stricture of artery: Secondary | ICD-10-CM

## 2021-07-31 ENCOUNTER — Ambulatory Visit (HOSPITAL_COMMUNITY)
Admission: RE | Admit: 2021-07-31 | Discharge: 2021-07-31 | Disposition: A | Discharge status: Home / Self Care | Payer: Medicare HMO | Source: Ambulatory Visit | Attending: Interventional Radiology | Admitting: Interventional Radiology

## 2021-07-31 DIAGNOSIS — I6502 Occlusion and stenosis of left vertebral artery: Secondary | ICD-10-CM | POA: Diagnosis not present

## 2021-07-31 DIAGNOSIS — I771 Stricture of artery: Secondary | ICD-10-CM | POA: Insufficient documentation

## 2021-07-31 DIAGNOSIS — R413 Other amnesia: Secondary | ICD-10-CM | POA: Diagnosis not present

## 2021-08-02 ENCOUNTER — Telehealth (HOSPITAL_COMMUNITY): Payer: Self-pay

## 2021-08-02 NOTE — Telephone Encounter (Signed)
Pt agreed to f/u in 2 years with an mra. AW  

## 2021-08-02 NOTE — Telephone Encounter (Signed)
Called pt regarding recent imaging, no answer, left vm. AW  

## 2023-07-05 ENCOUNTER — Encounter (HOSPITAL_COMMUNITY): Payer: Self-pay | Admitting: Interventional Radiology

## 2023-07-07 ENCOUNTER — Other Ambulatory Visit (HOSPITAL_COMMUNITY): Payer: Self-pay | Admitting: Interventional Radiology

## 2023-07-07 DIAGNOSIS — I771 Stricture of artery: Secondary | ICD-10-CM

## 2023-07-15 ENCOUNTER — Other Ambulatory Visit (HOSPITAL_COMMUNITY): Payer: Self-pay | Admitting: Interventional Radiology

## 2023-07-15 ENCOUNTER — Telehealth (HOSPITAL_COMMUNITY): Payer: Self-pay

## 2023-07-15 DIAGNOSIS — I771 Stricture of artery: Secondary | ICD-10-CM

## 2023-07-15 NOTE — Telephone Encounter (Signed)
 Called to cancel mri due to insurance not back. Pt will need a clinic visit prior to [facility] approval and with Dr. Dolphus leaving on 8/2 we will not be able to get both done. Pt would like to be referred to Dr. Rosalita for future f/u's. AB

## 2023-07-16 ENCOUNTER — Ambulatory Visit (HOSPITAL_COMMUNITY)

## 2023-07-22 ENCOUNTER — Encounter (HOSPITAL_COMMUNITY): Payer: Self-pay

## 2023-12-18 ENCOUNTER — Telehealth: Payer: Self-pay

## 2023-12-18 NOTE — Telephone Encounter (Signed)
 Patient called in requesting forms for Diabetic shoes. She is a new patient so I did schedule her in [place] to be seen by our physician first. Her appointment is not until 01/09/2023

## 2024-01-09 ENCOUNTER — Ambulatory Visit: Admitting: Podiatry

## 2024-01-09 DIAGNOSIS — E1142 Type 2 diabetes mellitus with diabetic polyneuropathy: Secondary | ICD-10-CM | POA: Diagnosis not present

## 2024-01-09 DIAGNOSIS — M14672 Charcot's joint, left ankle and foot: Secondary | ICD-10-CM

## 2024-01-09 NOTE — Progress Notes (Signed)
 "   Chief Complaint  Patient presents with   Diabetes    Here for DM Shoes. She has not been seen here before.  A1c was 6.2 in July 2025. ASA 325   Discussed the use of AI scribe software for clinical note transcription with the patient, who gave verbal consent to proceed.  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  She had spoken to our pedorthist previously and was scheduled to be seen by me for evaluation to see if she qualifies for the diabetic shoes.  She apparently had been told by our pedorthist that these could be fulfilled at our office, but our pedorthist is currently out on medical leave and will not be returning for several months.  We are currently referring out to [facility] for the diabetic shoes.  She has worn custom diabetic shoes/boots for the past five to six years, with her current pair in use for approximately eleven months. She has not received additional pairs of insoles and is currently using only one pair. She previously obtained her shoes from [facility], but was informed that they no longer provide this service and was advised to seek care elsewhere. She prefers Velcro closure over lace-up styles and has no complaints regarding the fit of her current shoes.  She describes a stiff first metatarsophalangeal joint, which contains a screw, and notes a rough spot on the hallux but no other areas of irritation. She denies pruritus between the toes. She experiences numbness in her feet, with diminished sensation at the tips of the toes and the ball of the foot. She perceives her feet as cool, and vibratory sensation is reduced in the forefoot. She denies current edema. She attributes improvement in swelling to dietary changes, specifically consuming cherries twice daily for the past four months, which she also  reports has resulted in a decrease in her blood glucose from 7.1 to 6.2.  She receives regular foot care at a spa every six weeks and does not trim her own nails. Her diabetes is managed by her family physician.     Past Medical History:  Diagnosis Date   Diabetes mellitus (HCC)    HTN (hypertension)    Hyperlipidemia    Hypothyroidism    Vertebrobasilar artery stenosis    Past Surgical History:  Procedure Laterality Date   IR ANGIO INTRA EXTRACRAN SEL COM CAROTID INNOMINATE BILAT MOD SED  07/20/2019   IR ANGIO VERTEBRAL SEL VERTEBRAL UNI R MOD SED  07/20/2019   IR GENERIC HISTORICAL  09/06/2015   IR RADIOLOGIST EVAL & MGMT 09/06/2015 [facility]   IR US  GUIDE VASC ACCESS RIGHT  07/20/2019   Allergies[1]  Physical Exam CARDIOVASCULAR: 1/4 palpable pedal pulses.  Sparse digital hair bilateral EXTREMITIES: No edema. MUSCULOSKELETAL: Charcot deformity of left midfoot at the tarsometatarsal joints.  Rigid hammer toe deformities present. NEUROLOGICAL: Absent protective sensation from lower leg to toes. Vibratory sensation absent in forefoot, diminished at ankles.   Assessment/Plan of Care: 1. Type 2 diabetes mellitus with diabetic polyneuropathy, unspecified whether long term insulin use (HCC)   2. Charcot joint of left foot     FOR HOME USE ONLY DME DIABETIC SHOE Assessment & Plan Diabetic polyneuropathy Chronic diabetic polyneuropathy with absent protective sensation and diminished vibratory sensation in both feet. Glycemic control has improved with dietary modifications and addition of  cherries into her diet. - Performed monofilament and vibratory sensation testing to assess neuropathy. - Initiated referral for new diabetic shoes and custom insoles through [facility], including preparation of required forms and coordination with her primary care provider for documentation.  Charcot joint and hammer toe deformities, left foot Charcot joint deformity of the left midfoot with residual  rigidity and prior surgical intervention, as well as hammer toe deformities. No current edema or signs of acute inflammation. Swelling has improved with dietary modifications. No current irritation except for a rough spot on the hallux. - Assessed left foot for irritation, swelling, and range of motion. - Advised continuation of regular foot care at a trusted spa every six weeks.  Follow-up as needed   Awanda CHARM Imperial, DPM, FACFAS [facility]     [address] [place], [place] [address]                Office [phone]  Fax [phone]      [1]  Allergies Allergen Reactions   Hydrocodone-Acetaminophen Itching    Other reaction(s): Tachycardia / Palpitations  (intolerance)   Latex     Other reaction(s): Other (See Comments) Blisters skin   Other Other (See Comments)    Blisters on the skin; Use paper tape on IV   Plavix [Clopidogrel Bisulfate]     Nose bleeds   Codeine Itching and Rash    Other reaction(s): Tachycardia / Palpitations  (intolerance)   Yellow Dye #6 (Sunset Yellow) Itching and Rash    Other reaction(s): Other (See Comments) Blisters **Pt. Cannot take any YELLOW medications**   "
